# Patient Record
Sex: Female | Born: 1940 | Race: White | Hispanic: No | Smoking: Never smoker
Health system: Southern US, Community
[De-identification: ages and names within clinical notes are randomized; demographics above are authoritative.]

## PROBLEM LIST (undated history)

## (undated) DIAGNOSIS — I4891 Unspecified atrial fibrillation: Secondary | ICD-10-CM

## (undated) DIAGNOSIS — G459 Transient cerebral ischemic attack, unspecified: Secondary | ICD-10-CM

## (undated) DIAGNOSIS — N200 Calculus of kidney: Secondary | ICD-10-CM

## (undated) DIAGNOSIS — M1990 Unspecified osteoarthritis, unspecified site: Secondary | ICD-10-CM

## (undated) DIAGNOSIS — I639 Cerebral infarction, unspecified: Secondary | ICD-10-CM

## (undated) HISTORY — PX: OOPHORECTOMY: SHX86

## (undated) HISTORY — PX: APPENDECTOMY: SHX54

## (undated) HISTORY — PX: MASTECTOMY: SHX3

## (undated) HISTORY — PX: CHOLECYSTECTOMY: SHX55

## (undated) HISTORY — PX: TOTAL KNEE ARTHROPLASTY: SHX125

## (undated) HISTORY — PX: HERNIA REPAIR: SHX51

## (undated) HISTORY — DX: Cerebral infarction, unspecified: I63.9

## (undated) HISTORY — PX: ABDOMINAL HYSTERECTOMY: SHX81

---

## 2008-09-15 ENCOUNTER — Emergency Department (HOSPITAL_BASED_OUTPATIENT_CLINIC_OR_DEPARTMENT_OTHER): Admission: EM | Admit: 2008-09-15 | Discharge: 2008-09-16 | Payer: Self-pay | Admitting: Emergency Medicine

## 2008-09-16 ENCOUNTER — Ambulatory Visit: Payer: Self-pay | Admitting: Diagnostic Radiology

## 2009-07-05 ENCOUNTER — Emergency Department (HOSPITAL_BASED_OUTPATIENT_CLINIC_OR_DEPARTMENT_OTHER): Admission: EM | Admit: 2009-07-05 | Discharge: 2009-07-05 | Payer: Self-pay | Admitting: Emergency Medicine

## 2009-07-11 ENCOUNTER — Emergency Department (HOSPITAL_BASED_OUTPATIENT_CLINIC_OR_DEPARTMENT_OTHER): Admission: EM | Admit: 2009-07-11 | Discharge: 2009-07-11 | Payer: Self-pay | Admitting: Emergency Medicine

## 2010-11-18 LAB — CBC
MCHC: 34.2 g/dL (ref 30.0–36.0)
Platelets: 240 10*3/uL (ref 150–400)
RBC: 4.22 MIL/uL (ref 3.87–5.11)
WBC: 9.1 10*3/uL (ref 4.0–10.5)

## 2010-11-18 LAB — DIFFERENTIAL
Eosinophils Absolute: 0.3 10*3/uL (ref 0.0–0.7)
Eosinophils Relative: 4 % (ref 0–5)
Lymphocytes Relative: 28 % (ref 12–46)
Lymphs Abs: 2.6 10*3/uL (ref 0.7–4.0)
Monocytes Absolute: 0.7 10*3/uL (ref 0.1–1.0)
Monocytes Relative: 7 % (ref 3–12)

## 2010-11-18 LAB — COMPREHENSIVE METABOLIC PANEL
ALT: 21 U/L (ref 0–35)
AST: 33 U/L (ref 0–37)
Albumin: 3.9 g/dL (ref 3.5–5.2)
CO2: 30 mEq/L (ref 19–32)
Calcium: 9.1 mg/dL (ref 8.4–10.5)
Chloride: 104 mEq/L (ref 96–112)
GFR calc Af Amer: >60 mL/min (ref >60)
GFR calc non Af Amer: >60 mL/min (ref >60)
Sodium: 141 mEq/L (ref 135–145)

## 2010-11-18 LAB — URINE MICROSCOPIC-ADD ON

## 2010-11-18 LAB — URINALYSIS, ROUTINE W REFLEX MICROSCOPIC
Bilirubin Urine: NEGATIVE
Hgb urine dipstick: NEGATIVE
Specific Gravity, Urine: 1.017 (ref 1.005–1.030)
pH: 7 (ref 5.0–8.0)

## 2010-11-18 LAB — LIPASE, BLOOD: Lipase: 146 U/L (ref 23–300)

## 2012-03-06 ENCOUNTER — Encounter (HOSPITAL_BASED_OUTPATIENT_CLINIC_OR_DEPARTMENT_OTHER): Payer: Self-pay | Admitting: *Deleted

## 2012-03-06 ENCOUNTER — Emergency Department (HOSPITAL_BASED_OUTPATIENT_CLINIC_OR_DEPARTMENT_OTHER)
Admission: EM | Admit: 2012-03-06 | Discharge: 2012-03-06 | Disposition: A | Discharge status: Home / Self Care | Payer: Medicare Other | Attending: Emergency Medicine | Admitting: Emergency Medicine

## 2012-03-06 DIAGNOSIS — T63481A Toxic effect of venom of other arthropod, accidental (unintentional), initial encounter: Secondary | ICD-10-CM | POA: Insufficient documentation

## 2012-03-06 DIAGNOSIS — Z8673 Personal history of transient ischemic attack (TIA), and cerebral infarction without residual deficits: Secondary | ICD-10-CM | POA: Insufficient documentation

## 2012-03-06 DIAGNOSIS — T6391XA Toxic effect of contact with unspecified venomous animal, accidental (unintentional), initial encounter: Secondary | ICD-10-CM | POA: Insufficient documentation

## 2012-03-06 DIAGNOSIS — T7840XA Allergy, unspecified, initial encounter: Secondary | ICD-10-CM

## 2012-03-06 DIAGNOSIS — M129 Arthropathy, unspecified: Secondary | ICD-10-CM | POA: Insufficient documentation

## 2012-03-06 HISTORY — DX: Unspecified osteoarthritis, unspecified site: M19.90

## 2012-03-06 HISTORY — DX: Calculus of kidney: N20.0

## 2012-03-06 HISTORY — DX: Transient cerebral ischemic attack, unspecified: G45.9

## 2012-03-06 MED ORDER — PREDNISONE 10 MG PO TABS: 20.0000 mg | ORAL_TABLET | Freq: Every day | ORAL | Status: DC

## 2012-03-06 MED ORDER — DIPHENHYDRAMINE HCL 25 MG PO TABS: 25.0000 mg | ORAL_TABLET | Freq: Four times a day (QID) | ORAL | Status: DC

## 2012-03-06 NOTE — ED Notes (Signed)
Pt reports she was stung by insect yesterday right forearm- redness and swelling present

## 2012-03-06 NOTE — ED Provider Notes (Signed)
History   This chart was scribed for Megan Quarry, MD by Shari Heritage. The patient was seen in room MH12/MH12. Patient's care was started at 2106.     CSN: [id number]  Arrival date & time 03/06/12  2106   First MD Initiated Contact with Patient 03/06/12 2152      Chief Complaint  Patient presents with  . Insect Bite    (Consider location/radiation/quality/duration/timing/severity/associated sxs/prior treatment) Patient is a 71 y.o. female presenting with allergic reaction. The history is provided by the patient. No language interpreter was used.  Allergic Reaction The primary symptoms are  rash. The current episode started yesterday. The problem has not changed since onset.This is a new problem.  The rash began yesterday. The rash appears on the right arm. The rash is associated with itching. The rash is not associated with blisters.  The onset of the reaction was associated with insect bite/sting ((possibly, but cause may be unknown)). Significant symptoms also include flushing and itching. Significant symptoms that are not present include eye redness or rhinorrhea.    Megan Riley is a 71 y.o. female who presents to the Emergency Department complaining of an insect bite to her right forearm 1 day ago. Patient says that she did not see the insect bite her, but she found a black stinger in her arm after working outside in her garden. Associated symptoms include redness, swelling and itching to the bite area. Patient denies any other symptoms or pain. Patient says that she has applied rubbing alcohol and Neosporin to the area with no relief from symptoms. Patient has a medical history of kidney stones, TIA and arthritis. Her surgical history includes mastectomy, oophorectomy, abdominal hysterectomy, total knee arthroplasty and appendectomy. Patient has never smoked.     Past Medical History  Diagnosis Date  . Kidney stones   . TIA (transient ischemic attack)   . Arthritis     Past  Surgical History  Procedure Date  . Mastectomy   . Oophorectomy   . Abdominal hysterectomy   . Total knee arthroplasty   . Appendectomy     No family history on file.  History  Substance Use Topics  . Smoking status: Never Smoker   . Smokeless tobacco: Never Used  . Alcohol Use: No    OB History    Grav Para Term Preterm Abortions TAB SAB Ect Mult Living                  Review of Systems  HENT: Negative for rhinorrhea.   Eyes: Negative for redness.  Skin: Positive for flushing, itching and rash.  All other systems reviewed and are negative.    Allergies  Reglan  Home Medications  No current outpatient prescriptions on file.  BP 162/78  Pulse 70  Temp 97.9 F (36.6 C) (Oral)  Resp 20  Ht 5' (1.524 m)  Wt 157 lb (71.215 kg)  BMI 30.66 kg/m2  SpO2 100%  Physical Exam  Nursing note and vitals reviewed. Constitutional: She is oriented to person, place, and time. She appears well-developed and well-nourished.  HENT:  Head: Normocephalic and atraumatic.  Eyes: Pupils are equal, round, and reactive to light.  Neck: Normal range of motion. Neck supple.  Cardiovascular: Normal rate and regular rhythm.   Pulmonary/Chest: Effort normal and breath sounds normal.  Abdominal: Soft. Bowel sounds are normal.  Musculoskeletal: Normal range of motion.  Neurological: She is alert and oriented to person, place, and time.  Skin: Skin is warm and  dry.       8 cm long area to right forearm with erythematous macules. Not warm. Not cellulitic.   Psychiatric: She has a normal mood and affect.    ED Course  Procedures (including critical care time) DIAGNOSTIC STUDIES: Oxygen Saturation is 100% on room air, normal by my interpretation.    COORDINATION OF CARE: 9:53pm- Patient informed of current plan for treatment and evaluation and agrees with plan at this time. Will discharge patient with prescriptions for Benadryl and Prednisone.    Labs Reviewed - No data to  display No results found.   No diagnosis found.    MDM  I personally performed the services described in this documentation, which was scribed in my presence. The recorded information has been reviewed and considered.   Megan Quarry, MD 03/06/12 [phone]

## 2013-08-12 ENCOUNTER — Emergency Department (HOSPITAL_BASED_OUTPATIENT_CLINIC_OR_DEPARTMENT_OTHER): Payer: Medicare Other

## 2013-08-12 ENCOUNTER — Emergency Department (HOSPITAL_BASED_OUTPATIENT_CLINIC_OR_DEPARTMENT_OTHER)
Admission: EM | Admit: 2013-08-12 | Discharge: 2013-08-12 | Disposition: A | Discharge status: Home / Self Care | Payer: Medicare Other | Attending: Emergency Medicine | Admitting: Emergency Medicine

## 2013-08-12 ENCOUNTER — Encounter (HOSPITAL_BASED_OUTPATIENT_CLINIC_OR_DEPARTMENT_OTHER): Payer: Self-pay | Admitting: Emergency Medicine

## 2013-08-12 DIAGNOSIS — R350 Frequency of micturition: Secondary | ICD-10-CM | POA: Insufficient documentation

## 2013-08-12 DIAGNOSIS — R1310 Dysphagia, unspecified: Secondary | ICD-10-CM | POA: Insufficient documentation

## 2013-08-12 DIAGNOSIS — R5381 Other malaise: Secondary | ICD-10-CM | POA: Insufficient documentation

## 2013-08-12 DIAGNOSIS — J029 Acute pharyngitis, unspecified: Secondary | ICD-10-CM | POA: Insufficient documentation

## 2013-08-12 DIAGNOSIS — Z79899 Other long term (current) drug therapy: Secondary | ICD-10-CM | POA: Insufficient documentation

## 2013-08-12 DIAGNOSIS — R5383 Other fatigue: Secondary | ICD-10-CM

## 2013-08-12 DIAGNOSIS — Z8739 Personal history of other diseases of the musculoskeletal system and connective tissue: Secondary | ICD-10-CM | POA: Insufficient documentation

## 2013-08-12 DIAGNOSIS — R531 Weakness: Secondary | ICD-10-CM

## 2013-08-12 DIAGNOSIS — R109 Unspecified abdominal pain: Secondary | ICD-10-CM | POA: Insufficient documentation

## 2013-08-12 DIAGNOSIS — R110 Nausea: Secondary | ICD-10-CM | POA: Insufficient documentation

## 2013-08-12 DIAGNOSIS — Z87442 Personal history of urinary calculi: Secondary | ICD-10-CM | POA: Insufficient documentation

## 2013-08-12 DIAGNOSIS — Z8673 Personal history of transient ischemic attack (TIA), and cerebral infarction without residual deficits: Secondary | ICD-10-CM | POA: Insufficient documentation

## 2013-08-12 DIAGNOSIS — J3489 Other specified disorders of nose and nasal sinuses: Secondary | ICD-10-CM | POA: Insufficient documentation

## 2013-08-12 LAB — CBC WITH DIFFERENTIAL/PLATELET
BASOS ABS: 0 10*3/uL (ref 0.0–0.1)
BASOS PCT: 0 % (ref 0–1)
Eosinophils Absolute: 0.3 10*3/uL (ref 0.0–0.7)
Eosinophils Relative: 3 % (ref 0–5)
HCT: 44.9 % (ref 36.0–46.0)
Hemoglobin: 14.7 g/dL (ref 12.0–15.0)
Lymphocytes Relative: 28 % (ref 12–46)
Lymphs Abs: 2.5 10*3/uL (ref 0.7–4.0)
MCH: 28.4 pg (ref 26.0–34.0)
MCHC: 32.7 g/dL (ref 30.0–36.0)
MCV: 86.8 fL (ref 78.0–100.0)
MONO ABS: 0.9 10*3/uL (ref 0.1–1.0)
Monocytes Relative: 10 % (ref 3–12)
NEUTROS ABS: 5.3 10*3/uL (ref 1.7–7.7)
NEUTROS PCT: 59 % (ref 43–77)
Platelets: 266 10*3/uL (ref 150–400)
RBC: 5.17 MIL/uL — ABNORMAL HIGH (ref 3.87–5.11)
RDW: 14.3 % (ref 11.5–15.5)
WBC: 9 10*3/uL (ref 4.0–10.5)

## 2013-08-12 LAB — URINALYSIS, ROUTINE W REFLEX MICROSCOPIC
Bilirubin Urine: NEGATIVE
Glucose, UA: NEGATIVE mg/dL
Hgb urine dipstick: NEGATIVE
KETONES UR: NEGATIVE mg/dL
NITRITE: NEGATIVE
PROTEIN: NEGATIVE mg/dL
Specific Gravity, Urine: 1.016 (ref 1.005–1.030)
UROBILINOGEN UA: 0.2 mg/dL (ref 0.0–1.0)
pH: 6.5 (ref 5.0–8.0)

## 2013-08-12 LAB — TROPONIN I: Troponin I: <0.3 ng/mL (ref <0.30)

## 2013-08-12 LAB — COMPREHENSIVE METABOLIC PANEL
ALBUMIN: 4 g/dL (ref 3.5–5.2)
ALT: 18 U/L (ref 0–35)
AST: 17 U/L (ref 0–37)
Alkaline Phosphatase: 107 U/L (ref 39–117)
BILIRUBIN TOTAL: 0.5 mg/dL (ref 0.3–1.2)
BUN: 9 mg/dL (ref 6–23)
CHLORIDE: 101 meq/L (ref 96–112)
CO2: 29 mEq/L (ref 19–32)
CREATININE: 0.8 mg/dL (ref 0.50–1.10)
Calcium: 9.2 mg/dL (ref 8.4–10.5)
GFR calc Af Amer: 85 mL/min — ABNORMAL LOW (ref >90)
GFR calc non Af Amer: 73 mL/min — ABNORMAL LOW (ref >90)
Glucose, Bld: 99 mg/dL (ref 70–99)
POTASSIUM: 4.1 meq/L (ref 3.7–5.3)
Sodium: 142 mEq/L (ref 137–147)
Total Protein: 7.5 g/dL (ref 6.0–8.3)

## 2013-08-12 LAB — URINE MICROSCOPIC-ADD ON

## 2013-08-12 LAB — RAPID STREP SCREEN (MED CTR MEBANE ONLY): STREPTOCOCCUS, GROUP A SCREEN (DIRECT): NEGATIVE

## 2013-08-12 MED ORDER — SODIUM CHLORIDE 0.9 % IV BOLUS (SEPSIS)
500.0000 mL | Freq: Once | INTRAVENOUS | Status: AC
  Administered 2013-08-12: 500 mL via INTRAVENOUS

## 2013-08-12 MED ORDER — ONDANSETRON 4 MG PO TBDP: ORAL_TABLET | ORAL | Status: DC

## 2013-08-12 MED ORDER — ONDANSETRON HCL 4 MG/2ML IJ SOLN
4.0000 mg | Freq: Once | INTRAMUSCULAR | Status: AC
  Administered 2013-08-12: 4 mg via INTRAVENOUS
  Filled 2013-08-12: qty 2

## 2013-08-12 NOTE — ED Provider Notes (Signed)
CSN: [id number]     Arrival date & time 08/12/13  [id number] History   First MD Initiated Contact with Patient 08/12/13 [phone]     Chief Complaint  Patient presents with  . Fatigue  . Sore Throat  . Nasal Congestion   (Consider location/radiation/quality/duration/timing/severity/associated sxs/prior Treatment) HPI Comments: 73 yo female with tia, on asa hx presents with general weakness and fatigue worsening the past week.  Pt has had multiple other sxs including cough, sore throat, recent nausea the past two days, mild epig cramping.  She has noticed randomly more difficulty swallowing food however usually swallows and keeps down both solids and liquids, no scope hx.  No known CA or recent weight loss.  Pt feels general fatigue, worse in the morning and with doing anything.  No cp or sob.  No cardiac hx. Non smoker, no alcohol.   Patient is a 73 y.o. female presenting with pharyngitis. The history is provided by the patient.  Sore Throat Associated symptoms include abdominal pain. Pertinent negatives include no chest pain, no headaches and no shortness of breath.    Past Medical History  Diagnosis Date  . Kidney stones   . TIA (transient ischemic attack)   . Arthritis    Past Surgical History  Procedure Laterality Date  . Mastectomy    . Oophorectomy    . Abdominal hysterectomy    . Total knee arthroplasty    . Appendectomy     No family history on file. History  Substance Use Topics  . Smoking status: Never Smoker   . Smokeless tobacco: Never Used  . Alcohol Use: No   OB History   Grav Para Term Preterm Abortions TAB SAB Ect Mult Living                 Review of Systems  Constitutional: Positive for fatigue. Negative for fever, chills and unexpected weight change.  HENT: Positive for congestion and sore throat.   Eyes: Negative for visual disturbance.  Respiratory: Negative for shortness of breath.   Cardiovascular: Negative for chest pain.  Gastrointestinal: Positive for  nausea and abdominal pain. Negative for vomiting.  Genitourinary: Positive for frequency. Negative for dysuria and flank pain.  Musculoskeletal: Negative for back pain, neck pain and neck stiffness.  Skin: Negative for rash.  Neurological: Positive for weakness. Negative for speech difficulty, light-headedness, numbness and headaches.    Allergies  Reglan  Home Medications   Current Outpatient Rx  Name  Route  Sig  Dispense  Refill  . aspirin 81 MG tablet   Oral   Take 81 mg by mouth daily.         Marland Kitchen. esomeprazole (NEXIUM) 20 MG capsule   Oral   Take 20 mg by mouth daily at 12 noon.          BP 141/80  Pulse 82  Temp(Src) 98 F (36.7 C) (Oral)  Resp 22  Ht 5' (1.524 m)  Wt 160 lb (72.576 kg)  BMI 31.25 kg/m2  SpO2 98% Physical Exam  Nursing note and vitals reviewed. Constitutional: She is oriented to person, place, and time. She appears well-developed and well-nourished.  HENT:  Head: Normocephalic and atraumatic.  Mild dry mm No trismus, uvular deviation, unilateral posterior pharyngeal edema or submandibular swelling.   Eyes: Conjunctivae are normal. Right eye exhibits no discharge. Left eye exhibits no discharge.  Neck: Normal range of motion. Neck supple. No tracheal deviation present.  Cardiovascular: Normal rate and regular rhythm.   No  murmur heard. Pulmonary/Chest: Effort normal and breath sounds normal.  Abdominal: Soft. She exhibits no distension. There is no tenderness. There is no guarding.  Musculoskeletal: She exhibits no edema and no tenderness.  Neurological: She is alert and oriented to person, place, and time.  5+ strength in UE and LE with f/e at major joints. Sensation to palpation intact in UE and LE. CNs 2-12 grossly intact.  EOMFI.  PERRL.   Finger nose and coordination intact bilateral.      Skin: Skin is warm. No rash noted.  Psychiatric: She has a normal mood and affect.    ED Course  Procedures (including critical care  time) Labs Review Labs Reviewed  URINALYSIS, ROUTINE W REFLEX MICROSCOPIC - Abnormal; Notable for the following:    Leukocytes, UA SMALL (*)    All other components within normal limits  CBC WITH DIFFERENTIAL - Abnormal; Notable for the following:    RBC 5.17 (*)    All other components within normal limits  COMPREHENSIVE METABOLIC PANEL - Abnormal; Notable for the following:    GFR calc non Af Amer 73 (*)    GFR calc Af Amer 85 (*)    All other components within normal limits  URINE MICROSCOPIC-ADD ON - Abnormal; Notable for the following:    Bacteria, UA FEW (*)    All other components within normal limits  RAPID STREP SCREEN  CULTURE, GROUP A STREP  TROPONIN I   Imaging Review Dg Chest 2 View  08/12/2013   CLINICAL DATA:  SORE THROAT, WEAKNESS, NAUSEA  EXAM: CHEST  2 VIEW  COMPARISON:  02/22/2013  FINDINGS: NORMAL HEART SIZE. LUNGS CLEAR. DENSE BREAST SHADOWS COMPATIBLE WITH IMPLANTS. NO FOCAL PNEUMONIA, COLLAPSE OR CONSOLIDATION. NO EDEMA PATTERN, PLEURAL EFFUSION OR PNEUMOTHORAX. TRACHEA MIDLINE. ATHEROSCLEROSIS NOTED OF THE AORTA. DEGENERATIVE CHANGES OF THE SPINE.  IMPRESSION: NO ACUTE CHEST PROCESS.   Electronically Signed   By: Ruel Favors M.D.   On: 08/12/2013 10:01    EKG Interpretation    Date/Time:  Saturday August 12 2013 09:33:25 EST Ventricular Rate:  67 PR Interval:  122 QRS Duration: 82 QT Interval:  424 QTC Calculation: 448 R Axis:   -2 Text Interpretation:  Normal sinus rhythm Normal ECG Confirmed by Kiyanna Biegler  MD, Klynn Linnemann ([id number]) on 08/12/2013 9:39:47 AM            MDM   1. General weakness   2. Sore throat   3. Difficulty swallowing   4. Nausea    Multiple vague sxs.  With age and general weakness plan on screening for metabolic, infection and cardiac. Well appearing in ED. EKG no acute findings.   Pt has close fup outpt. Normal exam in ED.  Fluids and nausea meds in ED>  Pt improved in ED, no abnormality on work up, pt will follow closely  with pcp.   Results and differential diagnosis were discussed with the patient. Close follow up outpatient was discussed, patient comfortable with the plan.         Enid Skeens, MD 08/12/13 1056

## 2013-08-12 NOTE — Discharge Instructions (Signed)
Follow closely with your physician, discuss checking your thyroid. If swallowing difficulty continues see GI doctor. If you have stroke symptoms (weakness on one side, vision changes, speech changes, other) return to ER immediately.  If you were given medicines take as directed.  If you are on coumadin or contraceptives realize their levels and effectiveness is altered by many different medicines.  If you have any reaction (rash, tongues swelling, other) to the medicines stop taking and see a physician.   Please follow up as directed and return to the ER or see a physician for new or worsening symptoms.  Thank you.

## 2013-08-12 NOTE — ED Notes (Addendum)
Sinus drainage, sore throat, generalized weakness, some nausea, some upper abdominal pain.  No known fever.  Increased urination.  Occasional difficulty swallowing food.

## 2013-08-12 NOTE — ED Notes (Signed)
Patient transported to X-ray 

## 2013-08-14 LAB — CULTURE, GROUP A STREP

## 2013-10-28 ENCOUNTER — Emergency Department (HOSPITAL_BASED_OUTPATIENT_CLINIC_OR_DEPARTMENT_OTHER)
Admission: EM | Admit: 2013-10-28 | Discharge: 2013-10-29 | Disposition: A | Discharge status: Home / Self Care | Payer: Medicare Other | Attending: Emergency Medicine | Admitting: Emergency Medicine

## 2013-10-28 ENCOUNTER — Encounter (HOSPITAL_BASED_OUTPATIENT_CLINIC_OR_DEPARTMENT_OTHER): Payer: Self-pay | Admitting: Emergency Medicine

## 2013-10-28 DIAGNOSIS — Z79899 Other long term (current) drug therapy: Secondary | ICD-10-CM | POA: Insufficient documentation

## 2013-10-28 DIAGNOSIS — R51 Headache: Secondary | ICD-10-CM | POA: Insufficient documentation

## 2013-10-28 DIAGNOSIS — I10 Essential (primary) hypertension: Secondary | ICD-10-CM

## 2013-10-28 DIAGNOSIS — M129 Arthropathy, unspecified: Secondary | ICD-10-CM | POA: Insufficient documentation

## 2013-10-28 DIAGNOSIS — Z8673 Personal history of transient ischemic attack (TIA), and cerebral infarction without residual deficits: Secondary | ICD-10-CM | POA: Insufficient documentation

## 2013-10-28 DIAGNOSIS — Z7982 Long term (current) use of aspirin: Secondary | ICD-10-CM | POA: Insufficient documentation

## 2013-10-28 DIAGNOSIS — Z87442 Personal history of urinary calculi: Secondary | ICD-10-CM | POA: Insufficient documentation

## 2013-10-28 NOTE — ED Notes (Signed)
Pt reports b/p elevated and has headache

## 2013-10-29 NOTE — ED Provider Notes (Signed)
CSN: [id number]     Arrival date & time 10/28/13  2254 History  This chart was scribed for No att. providers found by Marica Otter, ED Scribe. This patient was seen in room MHOTF/OTF and the patient's care was started at 6:01 AM.  PCP: Toniann Fail, MD    Chief Complaint  Patient presents with  . Hypertension   HPI HPI Comments: Shamera Yarberry is a 73 y.o. female who presents to the Emergency Department complaining of HTN onset two days ago. Pt reports her daughter took her blood pressure two nights ago on a whim and her BP was elevated. Pt reports she checked her BP a few more times and each reading was elevated as high as 199/91. Pt also complains of associated headache, onset yesterday, which is mild.. Pt denies having any history of HTN, SOB, changes in vision, chest pain and changes in urination patterns. Her blood pressure on arrival here was 167/66.   Past Medical History  Diagnosis Date  . Kidney stones   . TIA (transient ischemic attack)   . Arthritis    Past Surgical History  Procedure Laterality Date  . Mastectomy    . Oophorectomy    . Abdominal hysterectomy    . Total knee arthroplasty    . Appendectomy     No family history on file. History  Substance Use Topics  . Smoking status: Never Smoker   . Smokeless tobacco: Never Used  . Alcohol Use: No   OB History   Grav Para Term Preterm Abortions TAB SAB Ect Mult Living                 Review of Systems  Eyes: Negative for visual disturbance.  Respiratory: Negative for shortness of breath.   Cardiovascular: Negative for chest pain.  Neurological: Positive for headaches.  All other systems reviewed and are negative.   A complete 10 system review of systems was obtained and all systems are negative except as noted in the HPI and PMH.     Allergies  Reglan  Home Medications   Current Outpatient Rx  Name  Route  Sig  Dispense  Refill  . aspirin 81 MG tablet   Oral   Take 81 mg by mouth daily.          Marland Kitchen esomeprazole (NEXIUM) 20 MG capsule   Oral   Take 20 mg by mouth daily at 12 noon.         . ondansetron (ZOFRAN ODT) 4 MG disintegrating tablet      4mg  ODT q4 hours prn nausea/vomit   4 tablet   0    BP 167/66  Pulse 77  Temp(Src) 98.3 F (36.8 C) (Oral)  Resp 20  Ht 5' (1.524 m)  Wt 160 lb (72.576 kg)  BMI 31.25 kg/m2  SpO2 99% Physical Exam  Nursing note and vitals reviewed.  General: Well-developed, well-nourished female in no acute distress; appearance consistent with age of record HENT: normocephalic; atraumatic Eyes: pupils equal, round and reactive to light; extraocular muscles intact. Arcus senilis bilaterally.  Neck: supple Heart: regular rate and rhythm; no murmurs, rubs or gallops Lungs: clear to auscultation bilaterally Abdomen: soft; nondistended; nontender; no masses or hepatosplenomegaly; bowel sounds present Extremities: Mild arthritic changes; full range of motion; pulses normal, no edema.  Neurologic: Awake, alert and oriented; motor function intact in all extremities and symmetric; no facial droop Skin: Warm and dry Psychiatric: Normal mood and affect  ED Course  Procedures (including critical  care time) DIAGNOSTIC STUDIES: Oxygen Saturation is 99% on RA, normal by my interpretation.    COORDINATION OF CARE:  12:37 AM-Discussed treatment plan which includes: (1) labs, (2) not starting any hypertensive at this time because no evidence of end-organ damage, and (3) pt is advised to follow up with PCP. Pt understands and agrees to the plan, except pt chooses not to do labs at this time due to normal lab work two months ago.  Patient's blood pressure noted to be even lower at time of discharge than at time of arrival.  MDM   Final diagnoses:  Hypertension    I personally performed the services described in this documentation, which was scribed in my presence. The recorded information has been reviewed and is accurate.   Hanley SeamenJohn L Johnathen Testa,  MD 10/29/13 [phone]

## 2014-12-11 ENCOUNTER — Emergency Department (HOSPITAL_BASED_OUTPATIENT_CLINIC_OR_DEPARTMENT_OTHER)
Admission: EM | Admit: 2014-12-11 | Discharge: 2014-12-11 | Disposition: A | Discharge status: Home / Self Care | Payer: Medicare Other | Attending: Emergency Medicine | Admitting: Emergency Medicine

## 2014-12-11 ENCOUNTER — Emergency Department (HOSPITAL_BASED_OUTPATIENT_CLINIC_OR_DEPARTMENT_OTHER): Payer: Medicare Other

## 2014-12-11 ENCOUNTER — Encounter (HOSPITAL_BASED_OUTPATIENT_CLINIC_OR_DEPARTMENT_OTHER): Payer: Self-pay | Admitting: *Deleted

## 2014-12-11 DIAGNOSIS — Z8673 Personal history of transient ischemic attack (TIA), and cerebral infarction without residual deficits: Secondary | ICD-10-CM | POA: Diagnosis not present

## 2014-12-11 DIAGNOSIS — M1990 Unspecified osteoarthritis, unspecified site: Secondary | ICD-10-CM | POA: Diagnosis not present

## 2014-12-11 DIAGNOSIS — Z87442 Personal history of urinary calculi: Secondary | ICD-10-CM | POA: Diagnosis not present

## 2014-12-11 DIAGNOSIS — Y9339 Activity, other involving climbing, rappelling and jumping off: Secondary | ICD-10-CM | POA: Diagnosis not present

## 2014-12-11 DIAGNOSIS — W108XXA Fall (on) (from) other stairs and steps, initial encounter: Secondary | ICD-10-CM | POA: Insufficient documentation

## 2014-12-11 DIAGNOSIS — Y998 Other external cause status: Secondary | ICD-10-CM | POA: Insufficient documentation

## 2014-12-11 DIAGNOSIS — Z7982 Long term (current) use of aspirin: Secondary | ICD-10-CM | POA: Diagnosis not present

## 2014-12-11 DIAGNOSIS — Z79899 Other long term (current) drug therapy: Secondary | ICD-10-CM | POA: Diagnosis not present

## 2014-12-11 DIAGNOSIS — S8991XA Unspecified injury of right lower leg, initial encounter: Secondary | ICD-10-CM

## 2014-12-11 DIAGNOSIS — Y9289 Other specified places as the place of occurrence of the external cause: Secondary | ICD-10-CM | POA: Insufficient documentation

## 2014-12-11 NOTE — ED Notes (Signed)
Right knee injury. She jumped off a 3' deck.

## 2014-12-11 NOTE — ED Provider Notes (Signed)
CSN: [id number]     Arrival date & time 12/11/14  [id number] History  This chart was scribed for Elwin MochaBlair Kateryn Marasigan, MD by Abel PrestoKara Demonbreun, ED Scribe. This patient was seen in room MHFT1/MHFT1 and the patient's care was started at 7:42 PM.       Chief Complaint  Patient presents with  . Knee Injury     Patient is a 74 y.o. female presenting with knee pain. The history is provided by the patient. No language interpreter was used.  Knee Pain Location:  Knee Injury: yes   Mechanism of injury: fall   Fall:    Fall occurred: from a porch.   Height of fall:  3 feet   Impact surface:  Grass   Point of impact:  Feet   Entrapped after fall: no   Knee location:  R knee Pain details:    Quality:  Aching   Severity:  Mild   Onset quality:  Gradual (began a few hours after the fall)   Progression:  Unchanged Chronicity:  New Relieved by:  Rest Worsened by:  Bearing weight Associated symptoms: no fever    HPI Comments: Elder NegusLinda Langford is a 74 y.o. female with PMHx of TIA, arthritis, and renal calculi who presents to the Emergency Department complaining of right knee pain with onset just PTA.  Pt states she was cleaning deck with son and did not see stairs so she jumped down. She was wearing flip flops at the time of incident. Deck is approximately 3' high. Pt states right knee gave way and caused her to fall. She denies hearing a popping sensation or feeling of dislocation. She was able to ambulate afterwards. She notes mild swelling. Pt tried icing for relief. Pt denies hip pain, ankle pain, numbness, and tingling.   Past Medical History  Diagnosis Date  . Kidney stones   . TIA (transient ischemic attack)   . Arthritis    Past Surgical History  Procedure Laterality Date  . Mastectomy    . Oophorectomy    . Abdominal hysterectomy    . Total knee arthroplasty    . Appendectomy     No family history on file. History  Substance Use Topics  . Smoking status: Never Smoker   . Smokeless tobacco:  Never Used  . Alcohol Use: No   OB History    No data available     Review of Systems  Constitutional: Negative for fever and chills.  Respiratory: Negative for cough and shortness of breath.   Gastrointestinal: Negative for vomiting and abdominal pain.  Musculoskeletal: Positive for joint swelling and arthralgias.  Skin: Negative for wound.  Neurological: Positive for weakness. Negative for numbness.  All other systems reviewed and are negative.     Allergies  Reglan  Home Medications   Prior to Admission medications   Medication Sig Start Date End Date Taking? Authorizing Provider  Ranitidine HCl (ZANTAC PO) Take by mouth.   Yes Historical Provider, MD  aspirin 81 MG tablet Take 81 mg by mouth daily.    Historical Provider, MD  esomeprazole (NEXIUM) 20 MG capsule Take 20 mg by mouth daily at 12 noon.    Historical Provider, MD  ondansetron (ZOFRAN ODT) 4 MG disintegrating tablet 4mg  ODT q4 hours prn nausea/vomit 08/12/13   Blane OharaJoshua Zavitz, MD   Pulse 73  Temp(Src) 98.3 F (36.8 C) (Oral)  Resp 20  Ht 4' 11.5" (1.511 m)  Wt 157 lb (71.215 kg)  BMI 31.19 kg/m2  SpO2 100%  Physical Exam  Constitutional: She is oriented to person, place, and time. She appears well-developed and well-nourished. No distress.  HENT:  Head: Normocephalic and atraumatic.  Mouth/Throat: Oropharynx is clear and moist.  Eyes: EOM are normal. Pupils are equal, round, and reactive to light.  Neck: Normal range of motion. Neck supple.  Cardiovascular: Normal rate and regular rhythm.  Exam reveals no friction rub.   No murmur heard. Pulmonary/Chest: Effort normal and breath sounds normal. No respiratory distress. She has no wheezes. She has no rales.  Abdominal: Soft. She exhibits no distension. There is no tenderness. There is no rebound.  Musculoskeletal: Normal range of motion. She exhibits no edema.       Right knee: She exhibits normal range of motion, no swelling, no effusion, no laceration, no  erythema, normal alignment, no LCL laxity and no MCL laxity. Tenderness (upper medial tibia) found. Medial joint line tenderness noted. No MCL and no LCL tenderness noted.  Neurological: She is alert and oriented to person, place, and time.  Skin: She is not diaphoretic.  Nursing note and vitals reviewed.   ED Course  Procedures (including critical care time) DIAGNOSTIC STUDIES: Oxygen Saturation is 100% on room air, normal by my interpretation.    COORDINATION OF CARE: 7:46 PM Discussed treatment plan with patient at beside, the patient agrees with the plan and has no further questions at this time.   Labs Review Labs Reviewed - No data to display  Imaging Review Dg Knee Complete 4 Views Right  12/11/2014   CLINICAL DATA:  Patient jumped 3 feet off of deck, injuring right knee. Pain medially  EXAM: RIGHT KNEE - COMPLETE 4+ VIEW  COMPARISON:  None.  FINDINGS: Frontal, lateral, and bilateral oblique views were obtained. There is no fracture or dislocation. No joint effusion. There is moderate narrowing medially. There is moderate narrowing of the patellofemoral joint. There is spurring in all compartments, most severely along the patellofemoral joint and medially. No erosive change.  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 No fracture or joint effusion.   Electronically Signed   By: Bretta BangWilliam  Woodruff III M.D.   On: 12/11/2014 19:39     EKG Interpretation None      MDM   Final diagnoses:  Knee injury, right, initial encounter   74 year old female here with knee injury. She jumped off a deck and felt pain in her right knee a few hours after jumping. Should not fill immediate pop. Her need to give way after the fall but she was ambulatory afterwards. Here for range of motion of the knee. She has mild upper medial tibial tenderness. X-ray is normal. She's ambulatory here. Instructed to use cane as tolerated. Stable for discharge.  I  personally performed the services described in this documentation, which was scribed in my presence. The recorded information has been reviewed and is accurate.      Elwin MochaBlair Emori Kamau, MD 12/11/14 [phone]

## 2015-06-25 ENCOUNTER — Encounter (HOSPITAL_BASED_OUTPATIENT_CLINIC_OR_DEPARTMENT_OTHER): Payer: Self-pay

## 2015-06-25 ENCOUNTER — Emergency Department (HOSPITAL_BASED_OUTPATIENT_CLINIC_OR_DEPARTMENT_OTHER): Payer: Medicare Other

## 2015-06-25 ENCOUNTER — Emergency Department (HOSPITAL_BASED_OUTPATIENT_CLINIC_OR_DEPARTMENT_OTHER)
Admission: EM | Admit: 2015-06-25 | Discharge: 2015-06-25 | Disposition: A | Discharge status: Home / Self Care | Payer: Medicare Other | Attending: Emergency Medicine | Admitting: Emergency Medicine

## 2015-06-25 DIAGNOSIS — IMO0001 Reserved for inherently not codable concepts without codable children: Secondary | ICD-10-CM

## 2015-06-25 DIAGNOSIS — Z79899 Other long term (current) drug therapy: Secondary | ICD-10-CM | POA: Diagnosis not present

## 2015-06-25 DIAGNOSIS — R51 Headache: Secondary | ICD-10-CM | POA: Diagnosis not present

## 2015-06-25 DIAGNOSIS — R030 Elevated blood-pressure reading, without diagnosis of hypertension: Secondary | ICD-10-CM | POA: Diagnosis not present

## 2015-06-25 DIAGNOSIS — M1990 Unspecified osteoarthritis, unspecified site: Secondary | ICD-10-CM | POA: Insufficient documentation

## 2015-06-25 DIAGNOSIS — Z8673 Personal history of transient ischemic attack (TIA), and cerebral infarction without residual deficits: Secondary | ICD-10-CM | POA: Insufficient documentation

## 2015-06-25 DIAGNOSIS — Z7982 Long term (current) use of aspirin: Secondary | ICD-10-CM | POA: Diagnosis not present

## 2015-06-25 DIAGNOSIS — Z87442 Personal history of urinary calculi: Secondary | ICD-10-CM | POA: Insufficient documentation

## 2015-06-25 DIAGNOSIS — R519 Headache, unspecified: Secondary | ICD-10-CM

## 2015-06-25 MED ORDER — TRAMADOL HCL 50 MG PO TABS
50.0000 mg | ORAL_TABLET | Freq: Once | ORAL | Status: AC
  Administered 2015-06-25: 50 mg via ORAL
  Filled 2015-06-25: qty 1

## 2015-06-25 NOTE — ED Provider Notes (Signed)
CSN: [id number]     Arrival date & time 06/25/15  1259 History   First MD Initiated Contact with Patient 06/25/15 1308     Chief Complaint  Patient presents with  . Headache     (Consider location/radiation/quality/duration/timing/severity/associated sxs/prior Treatment) Patient is a 74 y.o. female presenting with headaches. The history is provided by the patient.  Headache Associated symptoms: no abdominal pain, no back pain, no congestion, no cough, no eye pain, no fever, no nausea, no neck pain, no numbness, no sinus pressure, no sore throat, no vomiting and no weakness   Patient c/o onset dull frontal headache at/around 10 AM, at rest. Was milder, more gradual in onset. Indicates slowly became worse. Headache right frontal and behind eye.  No eye pain or change in vision.  No change in speech. No numbness/weakness.   Pt indicates normally does not get headaches, indicates remote hx migraine. No sinus drainage or congestion. No neck pain or stiffness. No fevers at home. No recent head injury, trauma, fall. No syncope.   Pt states then took bp at home and was concerned as was high, 180's/90's.      Past Medical History  Diagnosis Date  . Kidney stones   . TIA (transient ischemic attack)   . Arthritis    Past Surgical History  Procedure Laterality Date  . Mastectomy    . Oophorectomy    . Abdominal hysterectomy    . Total knee arthroplasty    . Appendectomy    . Cholecystectomy     No family history on file. Social History  Substance Use Topics  . Smoking status: Never Smoker   . Smokeless tobacco: Never Used  . Alcohol Use: No   OB History    No data available     Review of Systems  Constitutional: Negative for fever and chills.  HENT: Negative for congestion, sinus pressure and sore throat.   Eyes: Negative for pain and visual disturbance.  Respiratory: Negative for cough and shortness of breath.   Cardiovascular: Negative for chest pain.  Gastrointestinal:  Negative for nausea, vomiting and abdominal pain.  Genitourinary: Negative for dysuria and flank pain.  Musculoskeletal: Negative for back pain and neck pain.  Skin: Negative for rash.  Neurological: Positive for headaches. Negative for weakness and numbness.  Hematological: Does not bruise/bleed easily.  Psychiatric/Behavioral: Negative for confusion.      Allergies  Reglan  Home Medications   Prior to Admission medications   Medication Sig Start Date End Date Taking? Authorizing Provider  aspirin 81 MG tablet Take 81 mg by mouth daily.    Historical Provider, MD  esomeprazole (NEXIUM) 20 MG capsule Take 20 mg by mouth daily at 12 noon.    Historical Provider, MD  Ranitidine HCl (ZANTAC PO) Take by mouth.    Historical Provider, MD   BP 165/63 mmHg  Pulse 72  Temp(Src) 97.9 F (36.6 C) (Oral)  Resp 16  Ht  (1.499 m)  Wt 68.04 kg  BMI 30.28 kg/m2  SpO2 99% Physical Exam  Constitutional: She is oriented to person, place, and time. She appears well-developed and well-nourished. No distress.  HENT:  Head: Atraumatic.  Nose: Nose normal.  Mouth/Throat: Oropharynx is clear and moist.  No sinus or temporal tenderness.  Eyes: Conjunctivae and EOM are normal. Pupils are equal, round, and reactive to light. No scleral icterus.  Pupils equal, briskly reactive bil, no corneal clouding. No eye pain or pain w eom.   Neck: Neck supple.  No tracheal deviation present. No thyromegaly present.  No stiffness or rigidity.   Cardiovascular: Normal rate, regular rhythm, normal heart sounds and intact distal pulses.  Exam reveals no gallop and no friction rub.   No murmur heard. Pulmonary/Chest: Effort normal and breath sounds normal. No respiratory distress.  Abdominal: Soft. Normal appearance and bowel sounds are normal. She exhibits no distension. There is no tenderness.  Genitourinary:  No cva tenderness.  Musculoskeletal: Normal range of motion. She exhibits no edema or  tenderness.  Neurological: She is alert and oriented to person, place, and time. No cranial nerve deficit.  Motor intact bilaterally. stre 5/5. sens grossly intact. Steady gait.   Skin: Skin is warm and dry. No rash noted. She is not diaphoretic.  Psychiatric: She has a normal mood and affect.  Nursing note and vitals reviewed.   ED Course  Procedures (including critical care time) Labs Review  Results for orders placed or performed during the hospital encounter of 08/12/13  Rapid strep screen  Result Value Ref Range   Streptococcus, Group A Screen (Direct) NEGATIVE NEGATIVE  Culture, Group A Strep  Result Value Ref Range   Specimen Description THROAT    Special Requests NONE    Culture      No Beta Hemolytic Streptococci Isolated Performed at [facility]   Report Status 08/14/2013 FINAL   Urinalysis, Routine w reflex microscopic  Result Value Ref Range   Color, Urine YELLOW YELLOW   APPearance CLEAR CLEAR   Specific Gravity, Urine 1.016 1.005 - 1.030   pH 6.5 5.0 - 8.0   Glucose, UA NEGATIVE NEGATIVE mg/dL   Hgb urine dipstick NEGATIVE NEGATIVE   Bilirubin Urine NEGATIVE NEGATIVE   Ketones, ur NEGATIVE NEGATIVE mg/dL   Protein, ur NEGATIVE NEGATIVE mg/dL   Urobilinogen, UA 0.2 0.0 - 1.0 mg/dL   Nitrite NEGATIVE NEGATIVE   Leukocytes, UA SMALL (A) NEGATIVE  CBC with Differential  Result Value Ref Range   WBC 9.0 4.0 - 10.5 K/uL   RBC 5.17 (H) 3.87 - 5.11 MIL/uL   Hemoglobin 14.7 12.0 - 15.0 g/dL   HCT 40.944.9 81.136.0 - 91.446.0 %   MCV 86.8 78.0 - 100.0 fL   MCH 28.4 26.0 - 34.0 pg   MCHC 32.7 30.0 - 36.0 g/dL   RDW 78.214.3 95.611.5 - 21.315.5 %   Platelets 266 150 - 400 K/uL   Neutrophils Relative % 59 43 - 77 %   Neutro Abs 5.3 1.7 - 7.7 K/uL   Lymphocytes Relative 28 12 - 46 %   Lymphs Abs 2.5 0.7 - 4.0 K/uL   Monocytes Relative 10 3 - 12 %   Monocytes Absolute 0.9 0.1 - 1.0 K/uL   Eosinophils Relative 3 0 - 5 %   Eosinophils Absolute 0.3 0.0 - 0.7 K/uL   Basophils  Relative 0 0 - 1 %   Basophils Absolute 0.0 0.0 - 0.1 K/uL  Comprehensive metabolic panel  Result Value Ref Range   Sodium 142 137 - 147 mEq/L   Potassium 4.1 3.7 - 5.3 mEq/L   Chloride 101 96 - 112 mEq/L   CO2 29 19 - 32 mEq/L   Glucose, Bld 99 70 - 99 mg/dL   BUN 9 6 - 23 mg/dL   Creatinine, Ser 0.860.80 0.50 - 1.10 mg/dL   Calcium 9.2 8.4 - 57.810.5 mg/dL   Total Protein 7.5 6.0 - 8.3 g/dL   Albumin 4.0 3.5 - 5.2 g/dL   AST 17 0 - 37  U/L   ALT 18 0 - 35 U/L   Alkaline Phosphatase 107 39 - 117 U/L   Total Bilirubin 0.5 0.3 - 1.2 mg/dL   GFR calc non Af Amer 73 (L) >90 mL/min   GFR calc Af Amer 85 (L) >90 mL/min  Troponin I  Result Value Ref Range   Troponin I <0.30 <0.30 ng/mL  Urine microscopic-add on  Result Value Ref Range   Squamous Epithelial / LPF RARE RARE   WBC, UA 0-2 <3 WBC/hpf   Bacteria, UA FEW (A) RARE   Ct Head Wo Contrast  06/25/2015  CLINICAL DATA:  Rt sided ha since waking this am. Elevated bp. EXAM: CT HEAD WITHOUT CONTRAST TECHNIQUE: Contiguous axial images were obtained from the base of the skull through the vertex without intravenous contrast. COMPARISON:  08/11/2012 by report only FINDINGS: Diffuse parenchymal atrophy. Patchy areas of hypoattenuation in deep and periventricular white matter bilaterally. Negative for acute intracranial hemorrhage, mass lesion, acute infarction, midline shift, or mass-effect. Acute infarct may be inapparent on noncontrast CT. Ventricles and sulci symmetric. Bone windows demonstrate no focal lesion. IMPRESSION: 1. Negative for bleed or other acute intracranial process. 2. Atrophy and nonspecific white matter changes. Electronically Signed   By: Corlis Leak M.D.   On: 06/25/2015 14:02      I have personally reviewed and evaluated these images and lab results as part of my medical decision-making.    MDM   Reviewed nursing notes and prior charts for additional history.   Pt has ride. Ultram po.  Ct.  Repeat bp.  bp  elevated, but not critically so, or requiring emergent tx.  Recheck pt comfortable. Nad.   Ct neg acute.  Pt currently appears stable for d/c.       Cathren Laine, MD 06/25/15 1422

## 2015-06-25 NOTE — ED Notes (Signed)
Family at bedside. 

## 2015-06-25 NOTE — ED Notes (Signed)
Pt. Has noted clear speech and memory.  Pt. Reports eating breakfast and some nausea with the nausea getting better.  No vomiting.  Pt. Has no neuro deficits.

## 2015-06-25 NOTE — ED Notes (Addendum)
C/o HA and nausea this am-BP at home 184/93-pt NAD-presents to triage in w/c-states she ambulates w/o assist

## 2015-06-25 NOTE — Discharge Instructions (Signed)
It was our pleasure to provide your ER care today - we hope that you feel better.  Take motrin or aleve as need for pain.   Follow up with primary care doctor in 1 week for recheck, recheck of blood pressure.  Return to ER if worse, new symptoms, fevers, severe headache, severe eye pain or change in vision, other concern.  You were given pain medication in the ER - no driving for the next 4 hours.      General Headache Without Cause A headache is pain or discomfort felt around the head or neck area. The specific cause of a headache may not be found. There are many causes and types of headaches. A few common ones are:  Tension headaches.  Migraine headaches.  Cluster headaches.  Chronic daily headaches. HOME CARE INSTRUCTIONS  Watch your condition for any changes. Take these steps to help with your condition: Managing Pain  Take over-the-counter and prescription medicines only as told by your health care provider.  Lie down in a dark, quiet room when you have a headache.  If directed, apply ice to the head and neck area:  Put ice in a plastic bag.  Place a towel between your skin and the bag.  Leave the ice on for 20 minutes, 2-3 times per day.  Use a heating pad or hot shower to apply heat to the head and neck area as told by your health care provider.  Keep lights dim if bright lights bother you or make your headaches worse. Eating and Drinking  Eat meals on a regular schedule.  Limit alcohol use.  Decrease the amount of caffeine you drink, or stop drinking caffeine. General Instructions  Keep all follow-up visits as told by your health care provider. This is important.  Keep a headache journal to help find out what may trigger your headaches. For example, write down:  What you eat and drink.  How much sleep you get.  Any change to your diet or medicines.  Try massage or other relaxation techniques.  Limit stress.  Sit up straight, and do not tense  your muscles.  Do not use tobacco products, including cigarettes, chewing tobacco, or e-cigarettes. If you need help quitting, ask your health care provider.  Exercise regularly as told by your health care provider.  Sleep on a regular schedule. Get 7-9 hours of sleep, or the amount recommended by your health care provider. SEEK MEDICAL CARE IF:   Your symptoms are not helped by medicine.  You have a headache that is different from the usual headache.  You have nausea or you vomit.  You have a fever. SEEK IMMEDIATE MEDICAL CARE IF:   Your headache becomes severe.  You have repeated vomiting.  You have a stiff neck.  You have a loss of vision.  You have problems with speech.  You have pain in the eye or ear.  You have muscular weakness or loss of muscle control.  You lose your balance or have trouble walking.  You feel faint or pass out.  You have confusion.   This information is not intended to replace advice given to you by your health care provider. Make sure you discuss any questions you have with your health care provider.   Document Released: 07/20/2005 Document Revised: 04/10/2015 Document Reviewed: 11/12/2014 Elsevier Interactive Patient Education 2016 [employer].    Hypertension Hypertension, commonly called high blood pressure, is when the force of blood pumping through your arteries is too strong.  Your arteries are the blood vessels that carry blood from your heart throughout your body. A blood pressure reading consists of a higher number over a lower number, such as 110/72. The higher number (systolic) is the pressure inside your arteries when your heart pumps. The lower number (diastolic) is the pressure inside your arteries when your heart relaxes. Ideally you want your blood pressure below 120/80. Hypertension forces your heart to work harder to pump blood. Your arteries may become narrow or stiff. Having untreated or uncontrolled hypertension can cause  heart attack, stroke, kidney disease, and other problems. RISK FACTORS Some risk factors for high blood pressure are controllable. Others are not.  Risk factors you cannot control include:   Race. You may be at higher risk if you are African American.  Age. Risk increases with age.  Gender. Men are at higher risk than women before age 72 years. After age 72, women are at higher risk than men. Risk factors you can control include:  Not getting enough exercise or physical activity.  Being overweight.  Getting too much fat, sugar, calories, or salt in your diet.  Drinking too much alcohol. SIGNS AND SYMPTOMS Hypertension does not usually cause signs or symptoms. Extremely high blood pressure (hypertensive crisis) may cause headache, anxiety, shortness of breath, and nosebleed. DIAGNOSIS To check if you have hypertension, your health care provider will measure your blood pressure while you are seated, with your arm held at the level of your heart. It should be measured at least twice using the same arm. Certain conditions can cause a difference in blood pressure between your right and left arms. A blood pressure reading that is higher than normal on one occasion does not mean that you need treatment. If it is not clear whether you have high blood pressure, you may be asked to return on a different day to have your blood pressure checked again. Or, you may be asked to monitor your blood pressure at home for 1 or more weeks. TREATMENT Treating high blood pressure includes making lifestyle changes and possibly taking medicine. Living a healthy lifestyle can help lower high blood pressure. You may need to change some of your habits. Lifestyle changes may include:  Following the DASH diet. This diet is high in fruits, vegetables, and whole grains. It is low in salt, red meat, and added sugars.  Keep your sodium intake below 2,300 mg per day.  Getting at least 30-45 minutes of aerobic exercise  at least 4 times per week.  Losing weight if necessary.  Not smoking.  Limiting alcoholic beverages.  Learning ways to reduce stress. Your health care provider may prescribe medicine if lifestyle changes are not enough to get your blood pressure under control, and if one of the following is true:  You are 6618-72 years of age and your systolic blood pressure is above 140.  You are 560 years of age or older, and your systolic blood pressure is above 150.  Your diastolic blood pressure is above 90.  You have diabetes, and your systolic blood pressure is over 140 or your diastolic blood pressure is over 90.  You have kidney disease and your blood pressure is above 140/90.  You have heart disease and your blood pressure is above 140/90. Your personal target blood pressure may vary depending on your medical conditions, your age, and other factors. HOME CARE INSTRUCTIONS  Have your blood pressure rechecked as directed by your health care provider.   Take medicines only as  directed by your health care provider. Follow the directions carefully. Blood pressure medicines must be taken as prescribed. The medicine does not work as well when you skip doses. Skipping doses also puts you at risk for problems.  Do not smoke.   Monitor your blood pressure at home as directed by your health care provider. SEEK MEDICAL CARE IF:   You think you are having a reaction to medicines taken.  You have recurrent headaches or feel dizzy.  You have swelling in your ankles.  You have trouble with your vision. SEEK IMMEDIATE MEDICAL CARE IF:  You develop a severe headache or confusion.  You have unusual weakness, numbness, or feel faint.  You have severe chest or abdominal pain.  You vomit repeatedly.  You have trouble breathing. MAKE SURE YOU:   Understand these instructions.  Will watch your condition.  Will get help right away if you are not doing well or get worse.   This information  is not intended to replace advice given to you by your health care provider. Make sure you discuss any questions you have with your health care provider.   Document Released: 07/20/2005 Document Revised: 12/04/2014 Document Reviewed: 05/12/2013 Elsevier Interactive Patient Education [employer]

## 2017-01-07 IMAGING — CT CT HEAD W/O CM
1 series · 16 of 30 positions shown, 20 images · non-contrast
Comparison: 08/11/2012 by report only

CLINICAL DATA: Rt sided ha since waking this am. Elevated bp.

EXAM:
CT HEAD WITHOUT CONTRAST
TECHNIQUE: Contiguous axial images were obtained from the base of the skull
through the vertex without intravenous contrast.

[Series 2: head 4.8 h37s · axial · 0.46mm/px · z∈[+735,+871]mm · 16 of 32 slices shown, 20 images]
[im 2/32  brain]
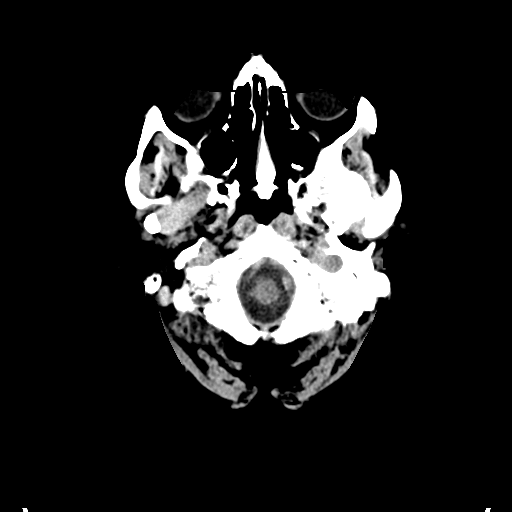
[im 2/32  bone]
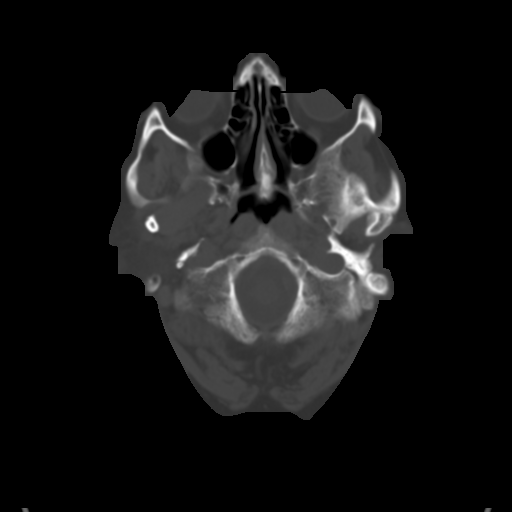
[im 4/32  brain]
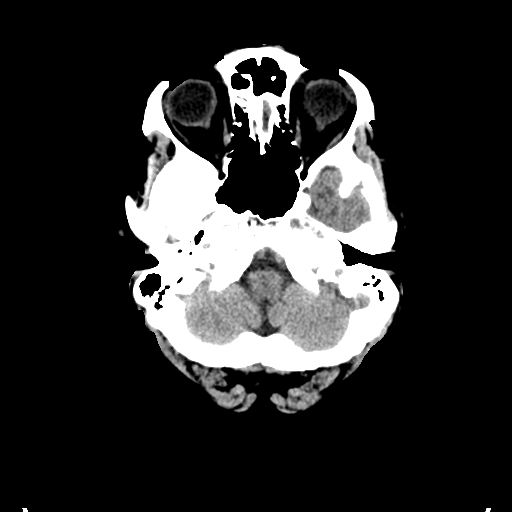
[im 6/32  brain]
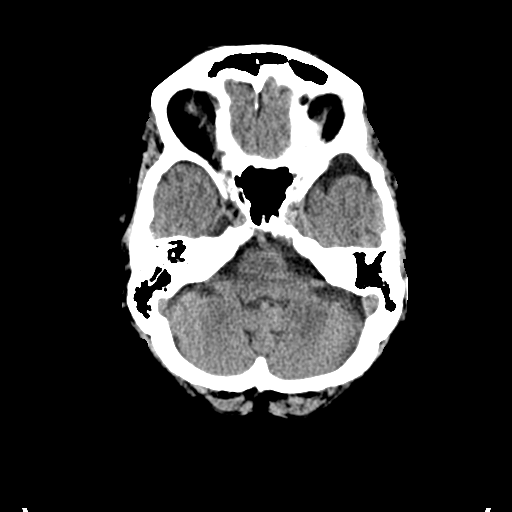
[im 8/32  brain]
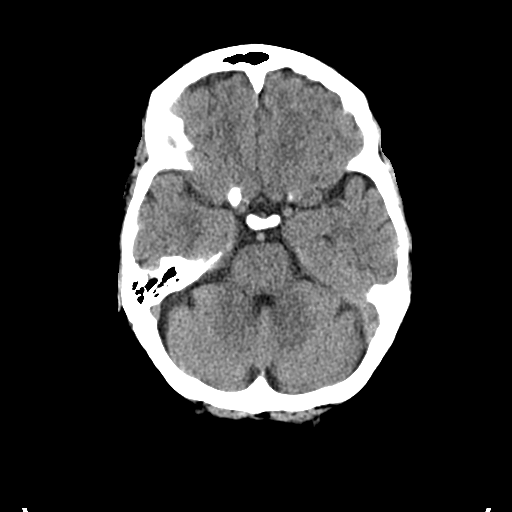
[im 9/32  brain]
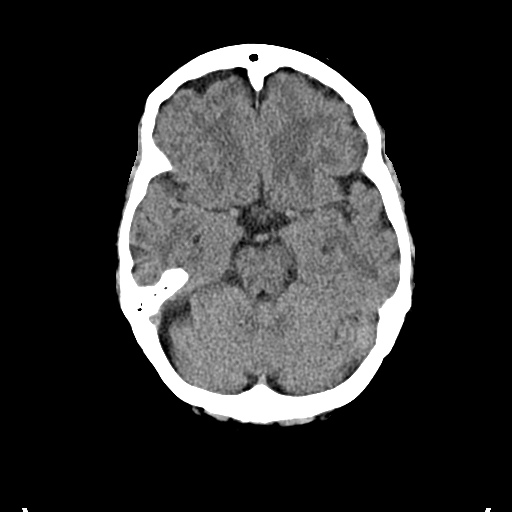
[im 9/32  bone]
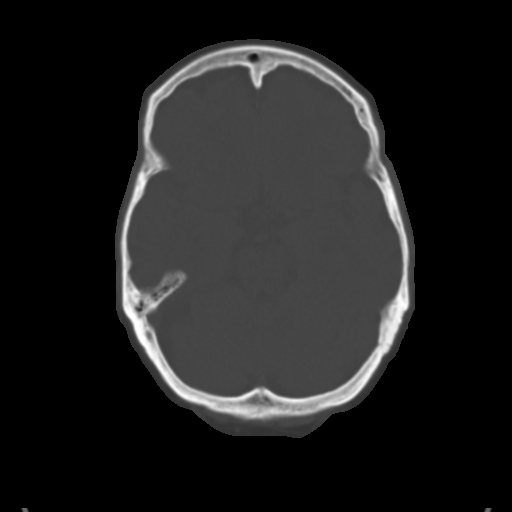
[im 11/32  brain]
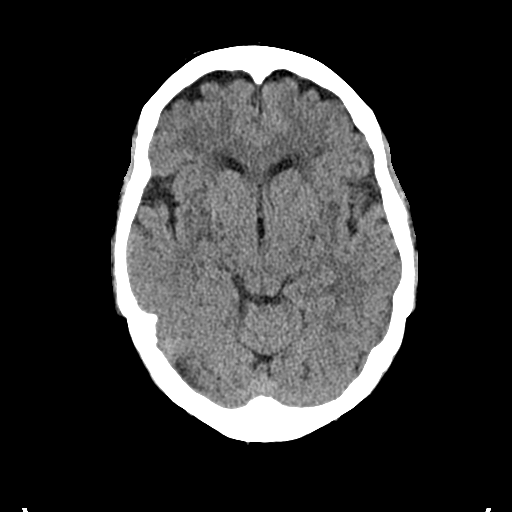
[im 13/32  brain]
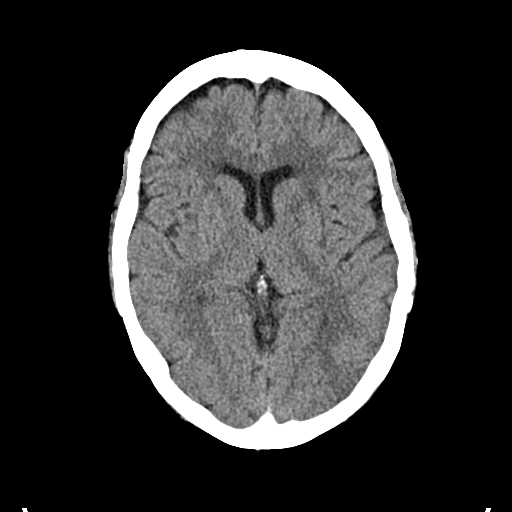
[im 15/32  brain]
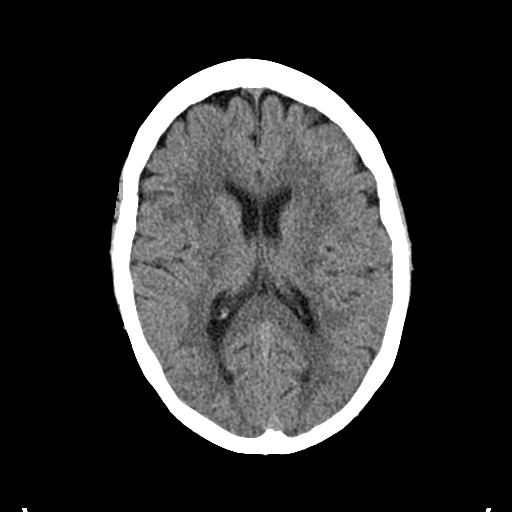
[im 17/32  brain]
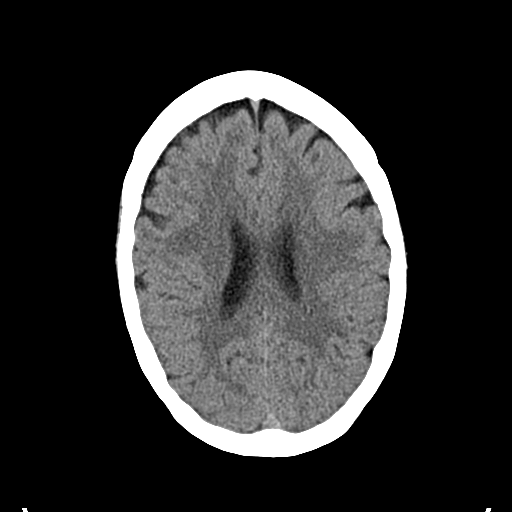
[im 17/32  bone]
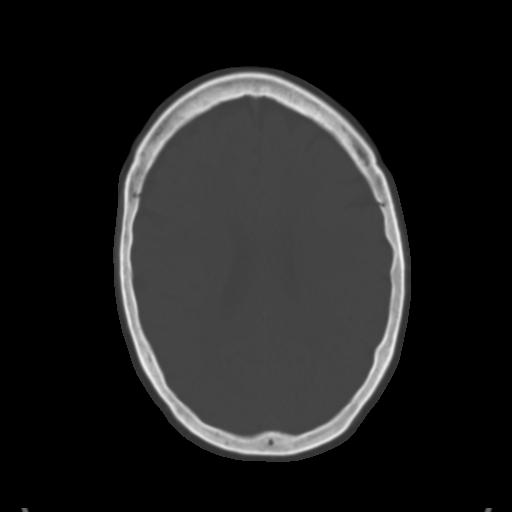
[im 19/32  brain]
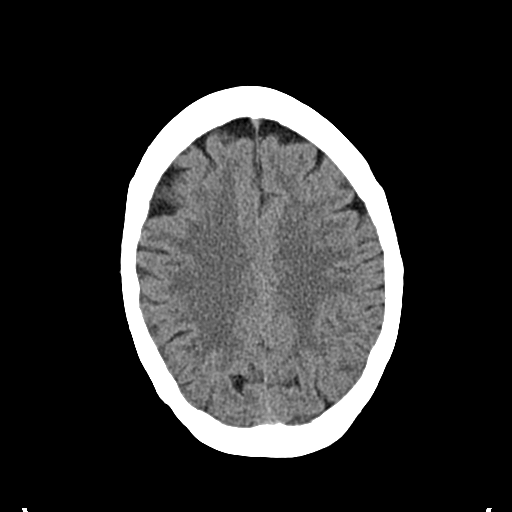
[im 21/32  brain]
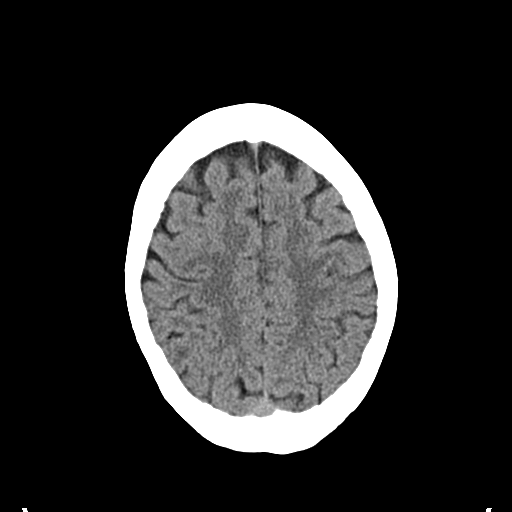
[im 23/32  brain]
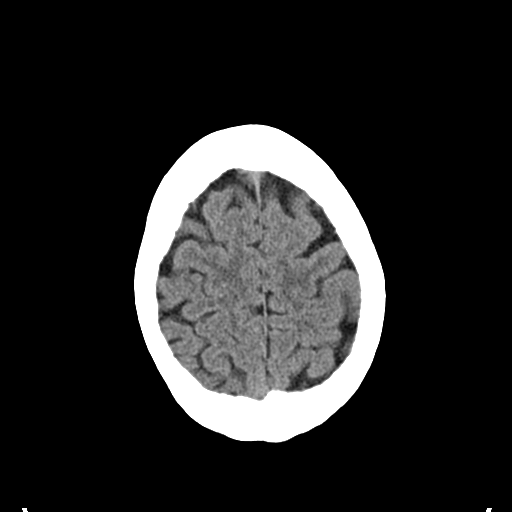
[im 24/32  brain]
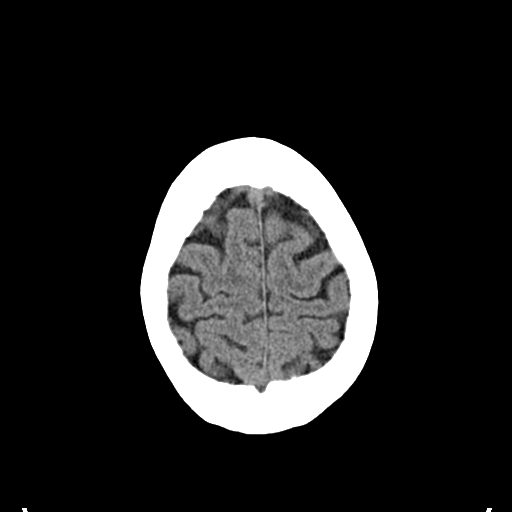
[im 24/32  bone]
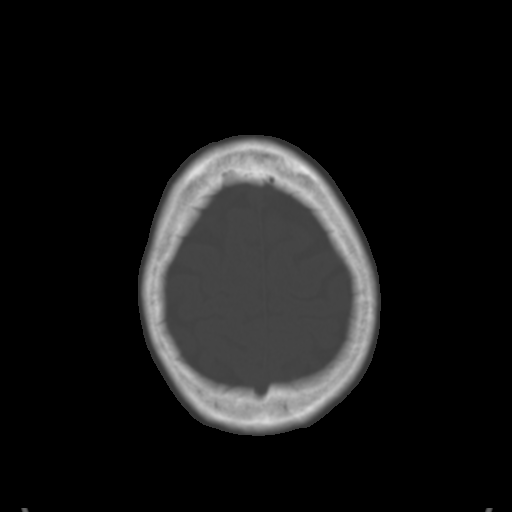
[im 26/32  brain]
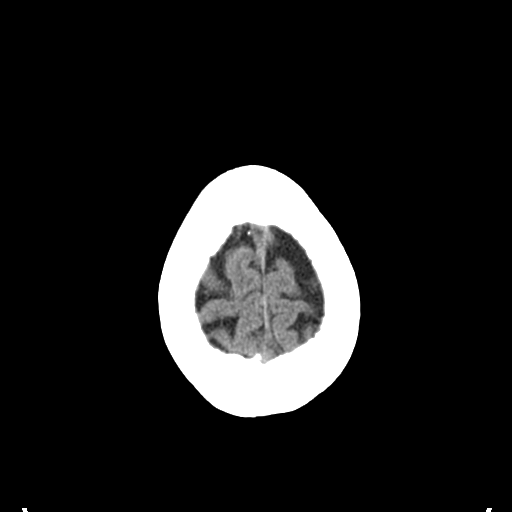
[im 28/32  brain]
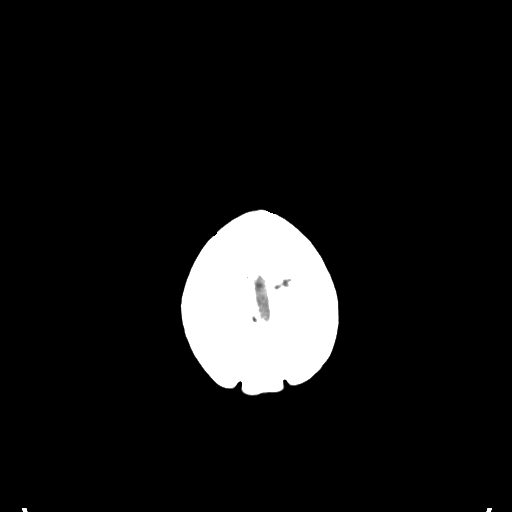
[im 30/32  brain]
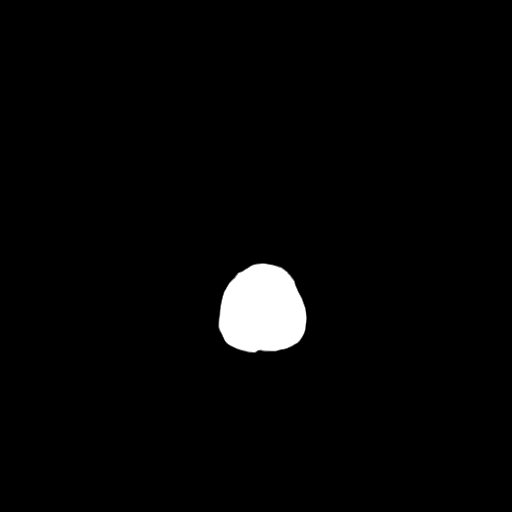

[16 of 30 positions shown; findings below may reference images not displayed]

FINDINGS: Diffuse parenchymal atrophy. Patchy areas of hypoattenuation in deep
and periventricular white matter bilaterally. Negative for acute
intracranial hemorrhage, mass lesion, acute infarction, midline
shift, or mass-effect. Acute infarct may be inapparent on
noncontrast CT. Ventricles and sulci symmetric. Bone windows
demonstrate no focal lesion.
IMPRESSION: 1. Negative for bleed or other acute intracranial process.
2. Atrophy and nonspecific white matter changes.

## 2017-04-01 ENCOUNTER — Encounter: Payer: Self-pay | Admitting: Neurology

## 2017-04-01 ENCOUNTER — Ambulatory Visit (INDEPENDENT_AMBULATORY_CARE_PROVIDER_SITE_OTHER): Payer: Medicare Other | Admitting: Neurology

## 2017-04-01 ENCOUNTER — Telehealth: Payer: Self-pay

## 2017-04-01 VITALS — BP 119/61 | HR 63 | Ht 59.0 in | Wt 163.2 lb

## 2017-04-01 DIAGNOSIS — E083493 Diabetes mellitus due to underlying condition with severe nonproliferative diabetic retinopathy without macular edema, bilateral: Secondary | ICD-10-CM

## 2017-04-01 DIAGNOSIS — G458 Other transient cerebral ischemic attacks and related syndromes: Secondary | ICD-10-CM

## 2017-04-01 DIAGNOSIS — E785 Hyperlipidemia, unspecified: Secondary | ICD-10-CM

## 2017-04-01 NOTE — Telephone Encounter (Signed)
See Dr. Pearlean BrownieSethi message below:

## 2017-04-01 NOTE — Telephone Encounter (Signed)
I called the patient's listed home number and left a voicemail explaining in detail that the purpose of listing diabetes as the diagnosis was only for ordering the hemoglobin A1c and that the patient does not have diabetes and the patient's consult note does not include this diagnosis on the problem list. She was advised to call me if she had questions

## 2017-04-01 NOTE — Patient Instructions (Signed)
I had a long d/w patient about her recent TI, atrial fibrillation, risk for recurrent stroke/TIAs, personally independently reviewed imaging studies and stroke evaluation results and answered questions.Continue Eliquis (apixaban) daily  for secondary stroke prevention  As there is no data to support switching to xarelto, Pradaxa or Syvasa is any better and maintain strict control of hypertension with blood pressure goal below 130/90, diabetes with hemoglobin A1c goal below 6.5% and lipids with LDL cholesterol goal below 70 mg/dL. I also advised the patient to eat a healthy diet with plenty of whole grains, cereals, fruits and vegetables, exercise regularly and maintain ideal body weight . Check MRI scan of the brain with MRA of the brain and neck and lipid profile and hemoglobin A1c. Followup in the future with me in 2 months or call earlier if necessary  Stroke Prevention Some medical conditions and behaviors are associated with an increased chance of having a stroke. You may prevent a stroke by making healthy choices and managing medical conditions. How can I reduce my risk of having a stroke?  Stay physically active. Get at least 30 minutes of activity on most or all days.  Do not smoke. It may also be helpful to avoid exposure to secondhand smoke.  Limit alcohol use. Moderate alcohol use is considered to be: ? No more than 2 drinks per day for men. ? No more than 1 drink per day for nonpregnant women.  Eat healthy foods. This involves: ? Eating 5 or more servings of fruits and vegetables a day. ? Making dietary changes that address high blood pressure (hypertension), high cholesterol, diabetes, or obesity.  Manage your cholesterol levels. ? Making food choices that are high in fiber and low in saturated fat, trans fat, and cholesterol may control cholesterol levels. ? Take any prescribed medicines to control cholesterol as directed by your health care provider.  Manage your  diabetes. ? Controlling your carbohydrate and sugar intake is recommended to manage diabetes. ? Take any prescribed medicines to control diabetes as directed by your health care provider.  Control your hypertension. ? Making food choices that are low in salt (sodium), saturated fat, trans fat, and cholesterol is recommended to manage hypertension. ? Ask your health care provider if you need treatment to lower your blood pressure. Take any prescribed medicines to control hypertension as directed by your health care provider. ? If you are 85-52 years of age, have your blood pressure checked every 3-5 years. If you are 10 years of age or older, have your blood pressure checked every year.  Maintain a healthy weight. ? Reducing calorie intake and making food choices that are low in sodium, saturated fat, trans fat, and cholesterol are recommended to manage weight.  Stop drug abuse.  Avoid taking birth control pills. ? Talk to your health care provider about the risks of taking birth control pills if you are over 54 years old, smoke, get migraines, or have ever had a blood clot.  Get evaluated for sleep disorders (sleep apnea). ? Talk to your health care provider about getting a sleep evaluation if you snore a lot or have excessive sleepiness.  Take medicines only as directed by your health care provider. ? For some people, aspirin or blood thinners (anticoagulants) are helpful in reducing the risk of forming abnormal blood clots that can lead to stroke. If you have the irregular heart rhythm of atrial fibrillation, you should be on a blood thinner unless there is a good reason you cannot take  them. ? Understand all your medicine instructions.  Make sure that other conditions (such as anemia or atherosclerosis) are addressed. Get help right away if:  You have sudden weakness or numbness of the face, arm, or leg, especially on one side of the body.  Your face or eyelid droops to one  side.  You have sudden confusion.  You have trouble speaking (aphasia) or understanding.  You have sudden trouble seeing in one or both eyes.  You have sudden trouble walking.  You have dizziness.  You have a loss of balance or coordination.  You have a sudden, severe headache with no known cause.  You have new chest pain or an irregular heartbeat. Any of these symptoms may represent a serious problem that is an emergency. Do not wait to see if the symptoms will go away. Get medical help at once. Call your local emergency services (911 in [place] Do not drive yourself to the hospital. This information is not intended to replace advice given to you by your health care provider. Make sure you discuss any questions you have with your health care provider. Document Released: 08/27/2004 Document Revised: 12/26/2015 Document Reviewed: 01/20/2013 Elsevier Interactive Patient Education  2017 [employer]

## 2017-04-01 NOTE — Progress Notes (Signed)
[facility] [address] [place]. [place] [address] [phone]       OFFICE CONSULT NOTE  Megan. Megan Riley Date of Birth:  01-12-1941 Medical Record Number:  [id number]   Referring MD:  Derek Jack Reason for Referral:  TIA HPI: Megan Riley is a pleasant 27 year Caucasian lady who had an 10 minute episode of transient left upper extremity numbness and tingling on 02/15/17. The patient is a good historian and able to describe the episode in detail. She stated that she was driving when she noticed that she was tired and had heart rates racing. She stopped at a drug store and took a blood pressure was around 180s/100. She is having some palpitations. Subsequently she developed tingling and numbness in the left arm from the shoulder down involving the whole hand. This lasted barely 10 minutes. She was able to drive home and rested. She states she did not have any speech difficulties or weakness or gait or balance problems. She called her medical doctor the next day. Who saw her in the clinic and felt she may have had a TIA. No recent imaging study or lab work has been ordered so far The patient has a history of paroxysmal atrial fibrillation and has been on eliquis since August 2017 when she was seen at [facility] with the TIA as well. I do not have access to those records. She has no history of hyperlipidemia but has had statin intolerance with multiple agents in the past. She has been taking Zetia for the last few months only. She does not smoke and states her blood pressure is quite good. She is tolerating eliquis well without bleeding or bruising. She has no new complaints today. ROS:   14 system review of systems is positive for  not enough sleep, insomnia, tingling, numbness in all the systems negative  PMH:  Past Medical History:  Diagnosis Date  . Arthritis   . Kidney stones   . Stroke (HCC)   . TIA (transient ischemic attack)     Social History:  Social  History   Social History  . Marital status: Widowed    Spouse name: N/A  . Number of children: N/A  . Years of education: N/A   Occupational History  . Not on file.   Social History Main Topics  . Smoking status: Never Smoker  . Smokeless tobacco: Never Used  . Alcohol use No  . Drug use: No  . Sexual activity: Not on file   Other Topics Concern  . Not on file   Social History Narrative  . No narrative on file    Medications:   Current Outpatient Prescriptions on File Prior to Visit  Medication Sig Dispense Refill  . Ranitidine HCl (ZANTAC PO) Take by mouth.     No current facility-administered medications on file prior to visit.     Allergies:   Allergies  Allergen Reactions  . Phentermine Other (See Comments)    "Loopy" and slurred speech  . Reglan [Metoclopramide] Itching  . Statins     age  . Hydromorphone Itching  . Meperidine Nausea And Vomiting  . Oxycodone-Aspirin Itching    Physical Exam General: Pleasant elderly Caucasian lady seated, in no evident distress Head: head normocephalic and atraumatic.   Neck: supple with no carotid or supraclavicular bruits Cardiovascular: regular rate and rhythm, no murmurs Musculoskeletal: no deformity Skin:  no rash/petichiae Vascular:  Normal pulses all extremities  Neurologic Exam Mental Status: Awake and  fully alert. Oriented to place and time. Recent and remote memory intact. Attention span, concentration and fund of knowledge appropriate. Mood and affect appropriate.  Cranial Nerves: Fundoscopic exam reveals sharp disc margins. Pupils equal, briskly reactive to light. Extraocular movements full without nystagmus. Visual fields full to confrontation. Hearing intact. Facial sensation intact. Face, tongue, palate moves normally and symmetrically.  Motor: Normal bulk and tone. Normal strength in all tested extremity muscles. Sensory.: intact to touch , pinprick , position and vibratory sensation.  Coordination:  Rapid alternating movements normal in all extremities. Finger-to-nose and heel-to-shin performed accurately bilaterally. Gait and Station: Arises from chair without difficulty. Stance is normal. Gait demonstrates normal stride length and balance . Able to heel, toe and tandem walk without difficulty.  Reflexes: 1+ and symmetric. Toes downgoing.   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 Also  maintain strict control of hypertension with blood pressure goal below 130/90, diabetes with hemoglobin A1c goal below 6.5% and lipids with LDL cholesterol goal below 70 mg/dL. I also advised the patient to eat a healthy diet with plenty of whole grains, cereals, fruits and vegetables, exercise regularly and maintain ideal body weight . Check MRI scan of the brain with MRA of the brain and neck and lipid profile and hemoglobin A1c. Patient has history of statin intolerance with multiple agents in past  and is presently on Zetia but if follow-up lipid profile is unsatisfactory we will switch her to get up out of her. Repatha or Praluent Greater than 50% time during this 45 minute consultation visit was spent on counseling and coordination of care about a TIA, atrial fibrillation and discussion about stroke prevention and treatment options Followup in the future with me in 2 months or call earlier if  necessary Delia HeadyPramod Sethi, MD  [facility] [address] [place], [place] [id number]  Phone [phone] Fax [phone] Note: This document was prepared with digital dictation and possible smart phrase technology. Any transcriptional errors that result from this process are unintentional.

## 2017-04-02 LAB — LIPID PANEL
CHOL/HDL RATIO: 3.8 ratio (ref 0.0–4.4)
CHOLESTEROL TOTAL: 177 mg/dL (ref 100–199)
HDL: 46 mg/dL (ref >39)
LDL CALC: 99 mg/dL (ref 0–99)
Triglycerides: 159 mg/dL — ABNORMAL HIGH (ref 0–149)
VLDL CHOLESTEROL CAL: 32 mg/dL (ref 5–40)

## 2017-04-02 LAB — HEMOGLOBIN A1C
ESTIMATED AVERAGE GLUCOSE: 108 mg/dL
HEMOGLOBIN A1C: 5.4 % (ref 4.8–5.6)

## 2017-04-06 ENCOUNTER — Telehealth: Payer: Self-pay

## 2017-04-06 NOTE — Telephone Encounter (Signed)
-----   Message from Micki RileyPramod S Sethi, MD sent at 04/02/2017  2:25 PM EDT ----- Megan RoachKindly inform the patient that test for diabetes and lipid profile is satisfactory but not optimal and she should stay on Zetia for now and does not need the new injectable medication I talked about

## 2017-04-06 NOTE — Telephone Encounter (Signed)
Rn call patient that her diabetes,and lipid panel was satisfactory, but continue to stay on the zetia. Her triglycerides was 159. NO need for the injectable medication. Pt verbalized understanding. ------

## 2017-04-15 ENCOUNTER — Ambulatory Visit
Admission: RE | Admit: 2017-04-15 | Discharge: 2017-04-15 | Disposition: A | Discharge status: Home / Self Care | Payer: Medicare Other | Source: Ambulatory Visit | Attending: Neurology | Admitting: Neurology

## 2017-04-15 ENCOUNTER — Ambulatory Visit
Admission: RE | Admit: 2017-04-15 | Discharge: 2017-04-15 | Disposition: A | Discharge status: Home / Self Care | Payer: PRIVATE HEALTH INSURANCE | Source: Ambulatory Visit | Attending: Neurology | Admitting: Neurology

## 2017-04-15 DIAGNOSIS — G458 Other transient cerebral ischemic attacks and related syndromes: Secondary | ICD-10-CM

## 2017-04-15 MED ORDER — GADOBENATE DIMEGLUMINE 529 MG/ML IV SOLN
15.0000 mL | Freq: Once | INTRAVENOUS | Status: AC | PRN
  Administered 2017-04-15: 15 mL via INTRAVENOUS

## 2017-04-19 ENCOUNTER — Telehealth: Payer: Self-pay

## 2017-04-19 NOTE — Telephone Encounter (Signed)
-----   Message from Micki Riley, MD sent at 04/18/2017 11:11 AM EDT ----- Joneen Roach inform the patient that MRA of the neck shows no significant narrowing of either carotid arteries in the neck. MRA of the brain shows moderate narrowing of one of the blood vessels towards the back on the left side but no worrisome finding. No need to do anything different at the present time.

## 2017-04-19 NOTE — Telephone Encounter (Signed)
Pt returned RN's call °

## 2017-04-19 NOTE — Telephone Encounter (Signed)
Left vm for patient to call back about MRA of neck results.   Left vm for patient to call back about MRI scan brain results.

## 2017-04-20 NOTE — Telephone Encounter (Signed)
Rn call patient that the MRi brain shows no new or worrisome findings. Mild shrinkage of brain slightly worse than previous MRI brain. Pt verbalized understanding. ------

## 2017-04-20 NOTE — Telephone Encounter (Signed)
Rn call patient about her MRA of neck shows no no significant narrowing of either carotid arteries in the neck. MRA of the brain shows moderate narrowing of one of the blood vessels towards the back on the left side but no worrisome finding. No need to do anything different. Pt verbalized understanding. ------

## 2017-06-07 ENCOUNTER — Encounter: Payer: Self-pay | Admitting: Neurology

## 2017-06-07 ENCOUNTER — Ambulatory Visit (INDEPENDENT_AMBULATORY_CARE_PROVIDER_SITE_OTHER): Payer: Medicare Other | Admitting: Neurology

## 2017-06-07 VITALS — BP 142/75 | HR 64 | Wt 165.0 lb

## 2017-06-07 DIAGNOSIS — G459 Transient cerebral ischemic attack, unspecified: Secondary | ICD-10-CM

## 2017-06-07 NOTE — Patient Instructions (Signed)
I had a long d/w patient about her recent TIA,atrial fibrillation, risk for recurrent stroke/TIAs, personally independently reviewed imaging studies and stroke evaluation results and answered questions.Continue Eliquis (apixaban) daily  for secondary stroke prevention and maintain strict control of hypertension with blood pressure goal below 130/90, diabetes with hemoglobin A1c goal below 6.5% and lipids with LDL cholesterol goal below 70 mg/dL. I also advised the patient to eat a healthy diet with plenty of whole grains, cereals, fruits and vegetables, exercise regularly and maintain ideal body weight Followup in the future with my nurse practitioner in 6 months or call earlier if necessary  Fat and Cholesterol Restricted Diet Getting too much fat and cholesterol in your diet may cause health problems. Following this diet helps keep your fat and cholesterol at normal levels. This can keep you from getting sick. What types of fat should I choose?  Choose monosaturated and polyunsaturated fats. These are found in foods such as olive oil, canola oil, flaxseeds, walnuts, almonds, and seeds.  Eat more omega-3 fats. Good choices include salmon, mackerel, sardines, tuna, flaxseed oil, and ground flaxseeds.  Limit saturated fats. These are in animal products such as meats, butter, and cream. They can also be in plant products such as palm oil, palm kernel oil, and coconut oil.  Avoid foods with partially hydrogenated oils in them. These contain trans fats. Examples of foods that have trans fats are stick margarine, some tub margarines, cookies, crackers, and other baked goods. What general guidelines do I need to follow?  Check food labels. Look for the words "trans fat" and "saturated fat."  When preparing a meal: ? Fill half of your plate with vegetables and green salads. ? Fill one fourth of your plate with whole grains. Look for the word "whole" as the first word in the ingredient list. ? Fill one  fourth of your plate with lean protein foods.  Eat more foods that have fiber, like apples, carrots, beans, peas, and barley.  Eat more home-cooked foods. Eat less at restaurants and buffets.  Limit or avoid alcohol.  Limit foods high in starch and sugar.  Limit fried foods.  Cook foods without frying them. Baking, boiling, grilling, and broiling are all great options.  Lose weight if you are overweight. Losing even a small amount of weight can help your overall health. It can also help prevent diseases such as diabetes and heart disease. What foods can I eat? Grains Whole grains, such as whole wheat or whole grain breads, crackers, cereals, and pasta. Unsweetened oatmeal, bulgur, barley, quinoa, or brown rice. Corn or whole wheat flour tortillas. Vegetables Fresh or frozen vegetables (raw, steamed, roasted, or grilled). Green salads. Fruits All fresh, canned (in natural juice), or frozen fruits. Meat and Other Protein Products Ground beef (85% or leaner), grass-fed beef, or beef trimmed of fat. Skinless chicken or [place]. Ground chicken or [place]. Pork trimmed of fat. All fish and seafood. Eggs. Dried beans, peas, or lentils. Unsalted nuts or seeds. Unsalted canned or dry beans. Dairy Low-fat dairy products, such as skim or 1% milk, 2% or reduced-fat cheeses, low-fat ricotta or cottage cheese, or plain low-fat yogurt. Fats and Oils Tub margarines without trans fats. Light or reduced-fat mayonnaise and salad dressings. Avocado. Olive, canola, sesame, or safflower oils. Natural peanut or almond butter (choose ones without added sugar and oil). The items listed above may not be a complete list of recommended foods or beverages. Contact your dietitian for more options. What foods are not recommended?  Grains White bread. White pasta. White rice. Cornbread. Bagels, pastries, and croissants. Crackers that contain trans fat. Vegetables White potatoes. Corn. Creamed or fried vegetables.  Vegetables in a cheese sauce. Fruits Dried fruits. Canned fruit in light or heavy syrup. Fruit juice. Meat and Other Protein Products Fatty cuts of meat. Ribs, chicken wings, bacon, sausage, bologna, salami, chitterlings, fatback, hot dogs, bratwurst, and packaged luncheon meats. Liver and organ meats. Dairy Whole or 2% milk, cream, half-and-half, and cream cheese. Whole milk cheeses. Whole-fat or sweetened yogurt. Full-fat cheeses. Nondairy creamers and whipped toppings. Processed cheese, cheese spreads, or cheese curds. Sweets and Desserts Corn syrup, sugars, honey, and molasses. Candy. Jam and jelly. Syrup. Sweetened cereals. Cookies, pies, cakes, donuts, muffins, and ice cream. Fats and Oils Butter, stick margarine, lard, shortening, ghee, or bacon fat. Coconut, palm kernel, or palm oils. Beverages Alcohol. Sweetened drinks (such as sodas, lemonade, and fruit drinks or punches). The items listed above may not be a complete list of foods and beverages to avoid. Contact your dietitian for more information. This information is not intended to replace advice given to you by your health care provider. Make sure you discuss any questions you have with your health care provider. Document Released: 01/19/2012 Document Revised: 03/26/2016 Document Reviewed: 10/19/2013 Elsevier Interactive Patient Education  [employer].

## 2017-06-07 NOTE — Progress Notes (Signed)
[facility] [address] [place].  [address] [phone]       OFFICE FOLLOW UP VISIt NOTE  Megan. Megan Riley Date of Birth:  1940-08-11 Medical Record Number:  [id number]   Referring MD:  Derek Jack Reason for Referral:  TIA [id number] consult 04/01/2017: Megan Riley is a pleasant 27 year Caucasian lady who had an 10 minute episode of transient left upper extremity numbness and tingling on 02/15/17. The patient is a good historian and able to describe the episode in detail. She stated that she was driving when she noticed that she was tired and had heart rates racing. She stopped at a drug store and took a blood pressure was around 180s/100. She is having some palpitations. Subsequently she developed tingling and numbness in the left arm from the shoulder down involving the whole hand. This lasted barely 10 minutes. She was able to drive home and rested. She states she did not have any speech difficulties or weakness or gait or balance problems. She called her medical doctor the next day. Who saw her in the clinic and felt she may have had a TIA. No recent imaging study or lab work has been ordered so far The patient has a history of paroxysmal atrial fibrillation and has been on eliquis since August 2017 when she was seen at [facility] with the TIA as well. I do not have access to those records. She has no history of hyperlipidemia but has had statin intolerance with multiple agents in the past. She has been taking Zetia for the last few months only. She does not smoke and states her blood pressure is quite good. She is tolerating eliquis well without bleeding or bruising. She has no new complaints today. Update 06/07/2017 : She returns for follow-up after initial consultation 2 months ago. She is accompanied by her friend Avneet. She states she's done well and has not had any recurrent TIA or strokelike symptoms. She is tolerating eliquis well without bleeding or  bruising. She had lab work done at last visit which showed LDL cholesterol 99 mg percent total cholesterol of 177 and HDL 46 mg percent. Triglycerides 156. Hemoglobin A1c was normal at 5.4. MRI scan of the brain done on 9/14 518 personally reviewed by me shows mild age-related generalized cerebral atrophy and white matter changes. These results slightly progressed compared with previous MRI. MRA of the brain showed moderate narrowing of the left posterior cerebral artery but no large vessel stenosis or occlusion. MRA of the neck showed no significant stenosis. Patient states her blood pressure is under good control with rate is borderline in office at 142/5. She has not had any new problems or new complaints since last visit she continues to live independently at home and has no restriction in activities of daily living ROS:   14 system review of systems is positive for  not enough sleep, fatigue, cough, apnea, numbness in all the systems negative  PMH:  Past Medical History:  Diagnosis Date  . Arthritis   . Kidney stones   . Stroke (HCC)   . TIA (transient ischemic attack)     Social History:  Social History   Socioeconomic History  . Marital status: Widowed    Spouse name: Not on file  . Number of children: Not on file  . Years of education: Not on file  . Highest education level: Not on file  Social Needs  . Financial resource strain: Not on file  . Food  insecurity - worry: Not on file  . Food insecurity - inability: Not on file  . Transportation needs - medical: Not on file  . Transportation needs - non-medical: Not on file  Occupational History  . Not on file  Tobacco Use  . Smoking status: Never Smoker  . Smokeless tobacco: Never Used  Substance and Sexual Activity  . Alcohol use: No  . Drug use: No  . Sexual activity: Not on file  Other Topics Concern  . Not on file  Social History Narrative  . Not on file    Medications:   Current Outpatient Medications on File  Prior to Visit  Medication Sig Dispense Refill  . Ascorbic Acid (VITAMIN C) 500 MG CAPS Take by mouth.    Marland Kitchen. atenolol (TENORMIN) 25 MG tablet Take daily by mouth.    . Calcium Carb-Cholecalciferol (CALCIUM-VITAMIN D) 500-200 MG-UNIT tablet Take by mouth.    Everlene Balls. ELIQUIS 5 MG TABS tablet     . esomeprazole (NEXIUM) 20 MG capsule Take 20 mg by mouth.    . ezetimibe (ZETIA) 10 MG tablet     . Multiple Vitamin (MULTI-VITAMINS) TABS Take by mouth.    . Ranitidine HCl (ZANTAC PO) Take as needed by mouth.      No current facility-administered medications on file prior to visit.     Allergies:   Allergies  Allergen Reactions  . Phentermine Other (See Comments)    "Loopy" and slurred speech  . Reglan [Metoclopramide] Itching  . Statins     age  . Hydromorphone Itching  . Meperidine Nausea And Vomiting  . Oxycodone-Aspirin Itching    Physical Exam General: Pleasant elderly Caucasian lady seated, in no evident distress Head: head normocephalic and atraumatic.   Neck: supple with no carotid or supraclavicular bruits Cardiovascular: regular rate and rhythm, no murmurs Musculoskeletal: no deformity Skin:  no rash/petichiae Vascular:  Normal pulses all extremities  Neurologic Exam Mental Status: Awake and fully alert. Oriented to place and time. Recent and remote memory intact. Attention span, concentration and fund of knowledge appropriate. Mood and affect appropriate.  Cranial Nerves: Fundoscopic exam reveals sharp disc margins. Pupils equal, briskly reactive to light. Extraocular movements full without nystagmus. Visual fields full to confrontation. Hearing intact. Facial sensation intact. Face, tongue, palate moves normally and symmetrically.  Motor: Normal bulk and tone. Normal strength in all tested extremity muscles. Sensory.: intact to touch , pinprick , position and vibratory sensation.  Coordination: Rapid alternating movements normal in all extremities. Finger-to-nose and  heel-to-shin performed accurately bilaterally. Gait and Station: Arises from chair without difficulty. Stance is normal. Gait demonstrates normal stride length and balance . Able to heel, toe and tandem walk without difficulty.  Reflexes: 1+ and symmetric. Toes downgoing.       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 Also  maintain strict control of hypertension with blood pressure goal below 130/90, diabetes with hemoglobin A1c goal below 6.5% and lipids with LDL cholesterol goal below 70 mg/dL. I also advised the patient to eat a healthy diet with plenty of whole grains, cereals, fruits and vegetables, exercise regularly and maintain ideal  body weight . Check MRI scan of the brain with MRA of the brain and neck and lipid profile and hemoglobin A1c. Patient has history of statin intolerance with multiple agents in past  and is presently on Zetia and I have advised her to be on a strict low-fat diet Greater than 50% time during this 25 minute  visit was spent on counseling and coordination of care about a TIA, atrial fibrillation and discussion about stroke prevention and treatment options Followup in the future with me in 6 months with my nurse practitioner or call earlier if necessary Delia Heady, MD  [facility] [address] [place], [place] [address]  Phone [phone] Fax [phone] Note: This document was prepared with digital dictation and possible smart phrase technology. Any transcriptional errors that  result from this process are unintentional.

## 2017-12-06 ENCOUNTER — Encounter: Payer: Self-pay | Admitting: Neurology

## 2017-12-06 ENCOUNTER — Ambulatory Visit (INDEPENDENT_AMBULATORY_CARE_PROVIDER_SITE_OTHER): Payer: Medicare Other | Admitting: Neurology

## 2017-12-06 VITALS — BP 138/57 | HR 78 | Ht 59.0 in | Wt 151.8 lb

## 2017-12-06 DIAGNOSIS — I6990 Unspecified sequelae of unspecified cerebrovascular disease: Secondary | ICD-10-CM

## 2017-12-06 NOTE — Progress Notes (Signed)
[facility] [address] [place]. [place] [address] [phone]       OFFICE FOLLOW UP VISIt NOTE  Megan. Megan Riley Date of Birth:  13-Apr-1941 Medical Record Number:  [id number]   Referring MD:  Derek Jack Reason for Referral:  TIA ION:[id number] consult 04/01/2017: Megan Riley is a pleasant 41 year Caucasian lady who had an 10 minute episode of transient left upper extremity numbness and tingling on 02/15/17. The patient is a good historian and able to describe the episode in detail. She stated that she was driving when she noticed that she was tired and had heart rates racing. She stopped at a drug store and took a blood pressure was around 180s/100. She is having some palpitations. Subsequently she developed tingling and numbness in the left arm from the shoulder down involving the whole hand. This lasted barely 10 minutes. She was able to drive home and rested. She states she did not have any speech difficulties or weakness or gait or balance problems. She called her medical doctor the next day. Who saw her in the clinic and felt she may have had a TIA. No recent imaging study or lab work has been ordered so far The patient has a history of paroxysmal atrial fibrillation and has been on eliquis since August 2017 when she was seen at [facility] with the TIA as well. I do not have access to those records. She has no history of hyperlipidemia but has had statin intolerance with multiple agents in the past. She has been taking Zetia for the last few months only. She does not smoke and states her blood pressure is quite good. She is tolerating eliquis well without bleeding or bruising. She has no new complaints today. Update 06/07/2017 : She returns for follow-up after initial consultation 2 months ago. She is accompanied by her friend Tayleigh. She states she's done well and has not had any recurrent TIA or strokelike symptoms. She is tolerating eliquis well without bleeding or  bruising. She had lab work done at last visit which showed LDL cholesterol 99 mg percent total cholesterol of 177 and HDL 46 mg percent. Triglycerides 156. Hemoglobin A1c was normal at 5.4. MRI scan of the brain done on 9/14 518 personally reviewed by me shows mild age-related generalized cerebral atrophy and white matter changes. These results slightly progressed compared with previous MRI. MRA of the brain showed moderate narrowing of the left posterior cerebral artery but no large vessel stenosis or occlusion. MRA of the neck showed no significant stenosis. Patient states her blood pressure is under good control with rate is borderline in office at 142/5. She has not had any new problems or new complaints since last visit she continues to live independently at home and has no restriction in activities of daily living Update 12/06/2017 :she returns for follow-up after last visit 6 months ago. She continues to well and has not had recurrent stroke or TIA symptoms. He remains on eliquis which is tolerating well without bleeding but does get minor bruising. She had a fall in February when she tripped on her steps. She had some bruising but fortunately no major bleeding. She states her blood pressure is well controlled and today it is 138/57. She had lipid profile checked a few months ago and it was unsatisfactory. She has history of statin intolerance and her cardiologist Dr. Heron Nay plans to prescribe Praluent   but is awaiting insurance approval. She has no new complaints today. ROS:  14 system review of systems is positive for  No complaints todayall the systems negative  PMH:  Past Medical History:  Diagnosis Date  . Arthritis   . Kidney stones   . Stroke (HCC)   . TIA (transient ischemic attack)     Social History:  Social History   Socioeconomic History  . Marital status: Widowed    Spouse name: Not on file  . Number of children: Not on file  . Years of education: Not on file  . Highest  education level: Not on file  Occupational History  . Not on file  Social Needs  . Financial resource strain: Not on file  . Food insecurity:    Worry: Not on file    Inability: Not on file  . Transportation needs:    Medical: Not on file    Non-medical: Not on file  Tobacco Use  . Smoking status: Never Smoker  . Smokeless tobacco: Never Used  Substance and Sexual Activity  . Alcohol use: No  . Drug use: No  . Sexual activity: Not on file  Lifestyle  . Physical activity:    Days per week: Not on file    Minutes per session: Not on file  . Stress: Not on file  Relationships  . Social connections:    Talks on phone: Not on file    Gets together: Not on file    Attends religious service: Not on file    Active member of club or organization: Not on file    Attends meetings of clubs or organizations: Not on file    Relationship status: Not on file  . Intimate partner violence:    Fear of current or ex partner: Not on file    Emotionally abused: Not on file    Physically abused: Not on file    Forced sexual activity: Not on file  Other Topics Concern  . Not on file  Social History Narrative  . Not on file    Medications:   Current Outpatient Medications on File Prior to Visit  Medication Sig Dispense Refill  . Alirocumab 75 MG/ML SOPN Inject into the skin.    . Ascorbic Acid (VITAMIN C) 500 MG CAPS Take by mouth.    Marland Kitchen atenolol (TENORMIN) 25 MG tablet Take daily by mouth.    . Calcium Carb-Cholecalciferol (CALCIUM-VITAMIN D) 500-200 MG-UNIT tablet Take by mouth.    Everlene Balls 5 MG TABS tablet     . esomeprazole (NEXIUM) 20 MG capsule Take 20 mg by mouth.    . Multiple Vitamin (MULTI-VITAMINS) TABS Take by mouth.    . Ranitidine HCl (ZANTAC PO) Take as needed by mouth.      No current facility-administered medications on file prior to visit.     Allergies:   Allergies  Allergen Reactions  . Phentermine Other (See Comments)    "Loopy" and slurred speech  . Reglan  [Metoclopramide] Itching  . Statins Other (See Comments)    age age age   . Hydromorphone Itching  . Meperidine Nausea And Vomiting  . Oxycodone-Aspirin Itching    Physical Exam General: Pleasant elderly Caucasian lady seated, in no evident distress Head: head normocephalic and atraumatic.   Neck: supple with no carotid or supraclavicular bruits Cardiovascular: regular rate and rhythm, no murmurs Musculoskeletal: no deformity Skin:  no rash/petichiae Vascular:  Normal pulses all extremities  Neurologic Exam Mental Status: Awake and fully alert. Oriented to place and time. Recent and remote memory intact. Attention  span, concentration and fund of knowledge appropriate. Mood and affect appropriate.  Cranial Nerves: Fundoscopic exam not done. Pupils equal, briskly reactive to light. Extraocular movements full without nystagmus. Visual fields full to confrontation. Hearing intact. Facial sensation intact. Face, tongue, palate moves normally and symmetrically.  Motor: Normal bulk and tone. Normal strength in all tested extremity muscles. Sensory.: intact to touch , pinprick , position and vibratory sensation.  Coordination: Rapid alternating movements normal in all extremities. Finger-to-nose and heel-to-shin performed accurately bilaterally. Gait and Station: Arises from chair without difficulty. Stance is normal. Gait demonstrates normal stride length and balance . Able to heel, toe and tandem walk with familydifficulty.  Reflexes: 1+ and symmetric. Toes downgoing.       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 Continue eliquis for stroke prevention and strict control of hypertension with blood pressure goal below  130/90 and lipids with LDL cholesterol goal below 70 mg percent. I advised her to follow-up with a cardiologist to start Praluent or Repatha injections since he has statin intolerance. She will return for follow-up in a year to see my NP Shanda Bumps or call earlier if necessary. Greater than 50% time during this 25 minute  visit was spent on counseling and coordination of care about a TIA, atrial fibrillation ,statin intolerance and discussion about stroke prevention and treatment options Followup in the future with me in 6 months with my nurse practitioner or call earlier if necessary Delia Heady, MD  [facility] [address] [place], [place] [address]  Phone [phone] Fax [phone] Note: This document was prepared with digital dictation and possible smart phrase technology. Any transcriptional errors that result from this process are unintentional.

## 2017-12-06 NOTE — Patient Instructions (Addendum)
I had a long discussion with the patient regarding her remote stroke and atrial fibrillation. Continue eliquis for stroke prevention and strict control of hypertension with blood pressure goal below 130/90 and lipids with LDL cholesterol goal below 70 mg percent. I advised her to follow-up with a cardiologist to start Praluent or Repatha injections since he has statin intolerance. She will return for follow-up in a year to see my NP Shanda Bumps or call earlier if necessary.

## 2018-12-06 ENCOUNTER — Telehealth: Payer: Self-pay

## 2018-12-06 NOTE — Telephone Encounter (Signed)
I called pt that her visit on 12/08/2018 will be change to video due to covid 19.Verbal consent obtained and to file insurance. Pt stated her PCP retired forget the name of her new provider. She will call us back with her new PCP. I stated provider will text her link to cell phone that has a camera on it. She will  click the link  5-10 minutes before your appointment time. You will be asked to enter your name (First and Last) and then it will put you into Jessica NP  "waiting room." Shanda Bumps NP should be with you at your scheduled appointment time, but please be patient because she may be running a few minutes behind. She verbalized understanding.

## 2018-12-08 ENCOUNTER — Other Ambulatory Visit: Payer: Self-pay

## 2018-12-08 ENCOUNTER — Encounter: Payer: Self-pay | Admitting: Adult Health

## 2018-12-08 ENCOUNTER — Ambulatory Visit (INDEPENDENT_AMBULATORY_CARE_PROVIDER_SITE_OTHER): Payer: Medicare Other | Admitting: Adult Health

## 2018-12-08 DIAGNOSIS — E785 Hyperlipidemia, unspecified: Secondary | ICD-10-CM

## 2018-12-08 DIAGNOSIS — G459 Transient cerebral ischemic attack, unspecified: Secondary | ICD-10-CM

## 2018-12-08 DIAGNOSIS — Z789 Other specified health status: Secondary | ICD-10-CM | POA: Diagnosis not present

## 2018-12-08 DIAGNOSIS — I10 Essential (primary) hypertension: Secondary | ICD-10-CM

## 2018-12-08 NOTE — Progress Notes (Signed)
I agree with the above plan 

## 2018-12-08 NOTE — Progress Notes (Signed)
[facility] [address] [place]. [place] [id number] [phone]       FOLLOW UP VISIt NOTE  Ms. Megan NegusLinda Riley Date of Birth:  19-Dec-1940 Medical Record Number:  [id number]   Referring MD:  Derek JackAldene Hawks Reason for Referral:  TIA  Virtual Visit via Video Note  I connected with Megan Riley on 12/08/18 at  2:45 PM EDT by a video enabled telemedicine application located remotely in my own home and verified that I am speaking with the correct person using two identifiers who was located at their own home.   Visit scheduled by Jeannene PatellaKatrina Murrel, RN. I discussed the limitations of evaluation and management by telemedicine and the availability of in person appointments. The patient expressed understanding and agreed to proceed.   HPI:  Megan Riley is a 78 y.o. female who is being followed in this office after right hemispheric TIA.  She initially had office follow-up scheduled today but due to COVID-19 CT precautions, visit transition to telemedicine via [email or web address] with patient's consent.   She has been stable from a stroke standpoint without recurrent or new stroke/TIA symptoms.  Continues on Eliquis for atrial fibrillation and secondary stroke prevention without bleeding or bruising. Tried Praluent but unable to tolerate due to muscle weakness. Discontinued and symptoms resolved. She is currently on Zetia for HDL management. Recent lipid panel obtained which showed improvement per patient (unable to see labs). Blood pressure stable with occasional flucuations. Has had a couple episodes of dizzy spells but she believes are related to sinuses. Was evaluated by PCP and obtained abx for sinus infection. No further concerns at this time.    ROS:   14 system review of systems is positive for  No complaints todayall the systems negative  PMH:  Past Medical History:  Diagnosis Date  . Arthritis   . Kidney stones   . Stroke (HCC)   . TIA (transient ischemic attack)     Social  History:  Social History   Socioeconomic History  . Marital status: Widowed    Spouse name: Not on file  . Number of children: Not on file  . Years of education: Not on file  . Highest education level: Not on file  Occupational History  . Not on file  Social Needs  . Financial resource strain: Not on file  . Food insecurity:    Worry: Not on file    Inability: Not on file  . Transportation needs:    Medical: Not on file    Non-medical: Not on file  Tobacco Use  . Smoking status: Never Smoker  . Smokeless tobacco: Never Used  Substance and Sexual Activity  . Alcohol use: No  . Drug use: No  . Sexual activity: Not on file  Lifestyle  . Physical activity:    Days per week: Not on file    Minutes per session: Not on file  . Stress: Not on file  Relationships  . Social connections:    Talks on phone: Not on file    Gets together: Not on file    Attends religious service: Not on file    Active member of club or organization: Not on file    Attends meetings of clubs or organizations: Not on file    Relationship status: Not on file  . Intimate partner violence:    Fear of current or ex partner: Not on file    Emotionally abused: Not on file    Physically abused: Not on file  Forced sexual activity: Not on file  Other Topics Concern  . Not on file  Social History Narrative  . Not on file    Medications:   Current Outpatient Medications on File Prior to Visit  Medication Sig Dispense Refill  . Alirocumab 75 MG/ML SOPN Inject into the skin.    . Ascorbic Acid (VITAMIN C) 500 MG CAPS Take by mouth.    Marland Kitchen atenolol (TENORMIN) 25 MG tablet Take daily by mouth.    . Calcium Carb-Cholecalciferol (CALCIUM-VITAMIN D) 500-200 MG-UNIT tablet Take by mouth.    Everlene Balls 5 MG TABS tablet     . esomeprazole (NEXIUM) 20 MG capsule Take 20 mg by mouth.    . Multiple Vitamin (MULTI-VITAMINS) TABS Take by mouth.    . Ranitidine HCl (ZANTAC PO) Take as needed by mouth.      No  current facility-administered medications on file prior to visit.     Allergies:   Allergies  Allergen Reactions  . Phentermine Other (See Comments)    "Loopy" and slurred speech  . Reglan [Metoclopramide] Itching  . Statins Other (See Comments)    age age age   . Hydromorphone Itching  . Meperidine Nausea And Vomiting  . Oxycodone-Aspirin Itching    Physical Exam General: Pleasant elderly Caucasian lady seated, in no evident distress Head: head normocephalic and atraumatic.     Neurologic Exam Mental Status: Awake and fully alert. Oriented to place and time. Recent and remote memory intact. Attention span, concentration and fund of knowledge appropriate. Mood and affect appropriate.  Cranial Nerves: Extraocular movements full without nystagmus. Hearing intact to voice. Facial sensation intact. Face, tongue, palate moves normally and symmetrically.  Motor: No evidence of weakness per drift assessment Sensory.: intact to touch Coordination: Rapid alternating movements normal in all extremities. Finger-to-nose and heel-to-shin performed accurately bilaterally. Gait and Station: Arises from chair without difficulty. Stance is normal. Gait demonstrates normal stride length and balance .  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  She has been stable from a stroke standpoint without recurring or new stroke/TIA symptoms.    PLAN: -Continue Eliquis and Zetia for secondary stroke prevention -Continue to follow with cardiology for Eliquis and atrial fibrillation management -Continue to follow with PCP for HTN and HLD management -Continue to stay active maintain healthy diet -Monitor BP at home -BP goal less than 130/90, LDL goal less than 70, A1c goal less than 7%  -Dizziness sensation likely related to sinuses and advised to follow-up with PCP for possible need of ENT referral if continues  Stable from stroke standpoint and will follow-up as needed  Greater than 50% time during this 25 minute non-face-to-face visit was spent on counseling and coordination of care about a TIA, atrial fibrillation ,statin intolerance and discussion about stroke prevention and treatment options    George Hugh, AGNP-BC  [facility] [address] [place], [place] [address]  Phone [phone] Fax [phone] Note: This document was prepared with digital dictation and possible smart phrase technology. Any transcriptional errors that result from this process are unintentional.

## 2020-08-02 ENCOUNTER — Encounter (HOSPITAL_BASED_OUTPATIENT_CLINIC_OR_DEPARTMENT_OTHER): Payer: Self-pay

## 2020-08-02 ENCOUNTER — Other Ambulatory Visit: Payer: Self-pay

## 2020-08-02 ENCOUNTER — Emergency Department (HOSPITAL_BASED_OUTPATIENT_CLINIC_OR_DEPARTMENT_OTHER): Payer: Medicare Other

## 2020-08-02 ENCOUNTER — Emergency Department (HOSPITAL_BASED_OUTPATIENT_CLINIC_OR_DEPARTMENT_OTHER)
Admission: EM | Admit: 2020-08-02 | Discharge: 2020-08-02 | Disposition: A | Discharge status: Home / Self Care | Payer: Medicare Other | Attending: Emergency Medicine | Admitting: Emergency Medicine

## 2020-08-02 DIAGNOSIS — R5381 Other malaise: Secondary | ICD-10-CM | POA: Diagnosis present

## 2020-08-02 DIAGNOSIS — U071 COVID-19: Secondary | ICD-10-CM

## 2020-08-02 DIAGNOSIS — Z96653 Presence of artificial knee joint, bilateral: Secondary | ICD-10-CM | POA: Insufficient documentation

## 2020-08-02 HISTORY — DX: Unspecified atrial fibrillation: I48.91

## 2020-08-02 LAB — CBC
HCT: 38.9 % (ref 36.0–46.0)
Hemoglobin: 12.7 g/dL (ref 12.0–15.0)
MCH: 29.3 pg (ref 26.0–34.0)
MCHC: 32.6 g/dL (ref 30.0–36.0)
MCV: 89.6 fL (ref 80.0–100.0)
Platelets: 195 10*3/uL (ref 150–400)
RBC: 4.34 MIL/uL (ref 3.87–5.11)
RDW: 13.5 % (ref 11.5–15.5)
WBC: 6 10*3/uL (ref 4.0–10.5)
nRBC: 0 % (ref 0.0–0.2)

## 2020-08-02 LAB — BASIC METABOLIC PANEL
Anion gap: 10 (ref 5–15)
BUN: 8 mg/dL (ref 8–23)
CO2: 24 mmol/L (ref 22–32)
Calcium: 9.1 mg/dL (ref 8.9–10.3)
Chloride: 105 mmol/L (ref 98–111)
Creatinine, Ser: 0.95 mg/dL (ref 0.44–1.00)
GFR, Estimated: >60 mL/min (ref >60)
Glucose, Bld: 99 mg/dL (ref 70–99)
Potassium: 3.7 mmol/L (ref 3.5–5.1)
Sodium: 139 mmol/L (ref 135–145)

## 2020-08-02 NOTE — ED Notes (Signed)
pcxr tbd when room is available- triage or otherwise

## 2020-08-02 NOTE — Discharge Instructions (Addendum)
Call your primary care doctor in the next 1-2 days to arrange video follow-up.  Use a finger pulse oximeter at home.  You may purchase one at [facility] or [facility] or online.  If the numbers drops and stays below 90%, return immediately back to the ER.  Otherwise increase your fluid intake, isolate at home for 5 days after symptoms resolve, and inform recent close contacts of the need to test for Covid.  

## 2020-08-02 NOTE — ED Notes (Signed)
Patient left before discharge vitals and discharge paperwork.

## 2020-08-02 NOTE — ED Provider Notes (Signed)
[facility] EMERGENCY DEPARTMENT Provider Note   CSN: [id number] Arrival date & time: 08/02/20  1608     History Chief Complaint  Patient presents with  . Covid Positive    Megan Riley is a 79 y.o. female.  Patient complaining of body aches generalized malaise and fatigue.  Had fevers yesterday.  Took a home Covid test and was found to be positive and presents to ER for further evaluation.  Denies vomiting, no chest pain or abdominal pain.        Past Medical History:  Diagnosis Date  . A-fib (HCC)   . Arthritis   . Kidney stones   . Stroke (HCC)   . TIA (transient ischemic attack)     There are no problems to display for this patient.   Past Surgical History:  Procedure Laterality Date  . ABDOMINAL HYSTERECTOMY    . APPENDECTOMY    . CHOLECYSTECTOMY    . HERNIA REPAIR    . MASTECTOMY    . OOPHORECTOMY    . TOTAL KNEE ARTHROPLASTY       OB History   No obstetric history on file.     No family history on file.  Social History   Tobacco Use  . Smoking status: Never Smoker  . Smokeless tobacco: Never Used  Vaping Use  . Vaping Use: Never used  Substance Use Topics  . Alcohol use: No  . Drug use: No    Home Medications Prior to Admission medications   Medication Sig Start Date End Date Taking? Authorizing Provider  Ascorbic Acid (VITAMIN C) 500 MG CAPS Take by mouth.    [provider]  atenolol (TENORMIN) 25 MG tablet Take daily by mouth.    [provider]  Calcium Carb-Cholecalciferol (CALCIUM-VITAMIN D) 500-200 MG-UNIT tablet Take by mouth.    [provider]  ELIQUIS 5 MG TABS tablet  03/26/17   [provider]  esomeprazole (NEXIUM) 20 MG capsule Take 20 mg by mouth.    [provider]  ezetimibe (ZETIA) 10 MG tablet Take 10 mg by mouth daily.    [provider]  Multiple Vitamin (MULTI-VITAMINS) TABS Take by mouth.    [provider]  Ranitidine HCl (ZANTAC PO) Take  as needed by mouth.     [provider]    Allergies    Peppermint oil, Phentermine, Reglan [metoclopramide], Statins, Hydromorphone, Meperidine, and Oxycodone-aspirin  Review of Systems   Review of Systems  Constitutional: Positive for fever.  HENT: Negative for ear pain.   Eyes: Negative for pain.  Respiratory: Positive for cough.   Cardiovascular: Negative for chest pain.  Gastrointestinal: Negative for abdominal pain.  Genitourinary: Negative for flank pain.  Musculoskeletal: Negative for back pain.  Skin: Negative for rash.  Neurological: Negative for headaches.    Physical Exam Updated Vital Signs BP 132/78 (BP Location: Right Arm)   Pulse 81   Temp 100.2 F (37.9 C) (Oral)   Resp 20   Ht 4' 11.5" (1.511 m)   Wt 72.6 kg   SpO2 95%   BMI 31.78 kg/m   Physical Exam Constitutional:      General: She is not in acute distress.    Appearance: Normal appearance.  HENT:     Head: Normocephalic.     Nose: Nose normal.  Eyes:     Extraocular Movements: Extraocular movements intact.  Cardiovascular:     Rate and Rhythm: Normal rate.  Pulmonary:     Effort: Pulmonary  effort is normal.  Musculoskeletal:        General: Normal range of motion.     Cervical back: Normal range of motion.  Neurological:     General: No focal deficit present.     Mental Status: She is alert. Mental status is at baseline.     ED Results / Procedures / Treatments   Labs (all labs ordered are listed, but only abnormal results are displayed) Labs Reviewed  BASIC METABOLIC PANEL  CBC    EKG None  Radiology DG Chest Portable 1 View  Result Date: 08/02/2020 CLINICAL DATA:  Cough for 3 days.  Positive home COVID test today. EXAM: PORTABLE CHEST 1 VIEW COMPARISON:  Radiograph 10/07/2019, CT 10/12/2019 FINDINGS: The cardiomediastinal contours are normal. Mild aortic atherosclerosis. Pulmonary vasculature is normal. No consolidation, pleural effusion, or pneumothorax. No acute  osseous abnormalities are seen. Degenerative change throughout the spine with scoliosis. Peripherally calcified breast implants causing mild overlying artifact at the lung bases. IMPRESSION: No acute chest findings. No evidence of pneumonia. Electronically Signed   By: Narda Rutherford M.D.   On: 08/02/2020 17:47    Procedures Procedures (including critical care time)  Medications Ordered in ED Medications - No data to display  ED Course  I have reviewed the triage vital signs and the nursing notes.  Pertinent labs & imaging results that were available during my care of the patient were reviewed by me and considered in my medical decision making (see chart for details).    MDM Rules/Calculators/A&P                          Patient's lab work is unremarkable.  Chest x-ray shows no infiltrate effusion or edema.  Discussed with the patient possibility of receiving monoclonal antibodies to risks and benefits were discussed.  Patient declined the procedure.  She prefers to go home.  Advised her to return immediately if she has trouble breathing or any additional concerns otherwise advised fluid intake to be increased at home rest and isolation until symptoms have resolved and 5 days have passed after that.  Advised immediate return if her home finger pulse oximeter drops below 90% or if she has any worsening symptoms.   Final Clinical Impression(s) / ED Diagnoses Final diagnoses:  COVID-19 virus infection    Rx / DC Orders ED Discharge Orders    None       Cheryll Cockayne, MD 08/02/20 [phone]

## 2020-08-02 NOTE — ED Triage Notes (Signed)
Pt states she tested positive for COVID today on a home test pt states she wanted to get checked out. Pt reports a temp with TMax of 100, cough, body aches, and HA.

## 2020-08-03 ENCOUNTER — Emergency Department (HOSPITAL_BASED_OUTPATIENT_CLINIC_OR_DEPARTMENT_OTHER)
Admission: EM | Admit: 2020-08-03 | Discharge: 2020-08-03 | Disposition: A | Discharge status: Home / Self Care | Payer: Medicare Other | Attending: Emergency Medicine | Admitting: Emergency Medicine

## 2020-08-03 ENCOUNTER — Encounter (HOSPITAL_BASED_OUTPATIENT_CLINIC_OR_DEPARTMENT_OTHER): Payer: Self-pay

## 2020-08-03 DIAGNOSIS — Z96659 Presence of unspecified artificial knee joint: Secondary | ICD-10-CM | POA: Diagnosis not present

## 2020-08-03 DIAGNOSIS — U071 COVID-19: Secondary | ICD-10-CM | POA: Diagnosis not present

## 2020-08-03 DIAGNOSIS — I4891 Unspecified atrial fibrillation: Secondary | ICD-10-CM | POA: Diagnosis not present

## 2020-08-03 DIAGNOSIS — Z8673 Personal history of transient ischemic attack (TIA), and cerebral infarction without residual deficits: Secondary | ICD-10-CM | POA: Diagnosis not present

## 2020-08-03 DIAGNOSIS — Z7901 Long term (current) use of anticoagulants: Secondary | ICD-10-CM | POA: Insufficient documentation

## 2020-08-03 DIAGNOSIS — R059 Cough, unspecified: Secondary | ICD-10-CM | POA: Diagnosis present

## 2020-08-03 NOTE — ED Triage Notes (Signed)
She states she has had uri sx x 3 days. Dx with Covid and wishes to "get the infusion" ?monoclonal antibodies?. She is in no distress. She chooses to sit in a regular chair, which I allow her to do.

## 2020-08-03 NOTE — Discharge Instructions (Addendum)
Your information has been provided with the Infusion Center and they will call you if they have any availability in the next 6 days.   Continue monitoring your symptoms at home. Drink plenty of fluids to stay hydrated. Take Tylenol as needed for fevers > 100.4.   Follow up with your PCP once you are finished with your isolation for reevaluation.   Return to the ED IMMEDIATELY for any worsening symptoms including shortness of breath, severe chest pain, fingers/lips turning blue, inability to waken easily, new confusion, or any other new/concerning symptoms.

## 2020-08-03 NOTE — ED Provider Notes (Signed)
[facility] EMERGENCY DEPARTMENT Provider Note   CSN: [id number] Arrival date & time: 08/03/20  1937     History Chief Complaint  Patient presents with  . URI    Megan Riley is a 80 y.o. female with PMHx stroke/TIA, A fib on Eliquis, arthritis who presents to the ED today requesting MAB.  Patient was seen in the ED yesterday with complaints of upper respiratory infection and tested positive for COVID-19.  It does appear that monoclonal antibodies were discussed with patient yesterday however she declined and she was discharged home.  She returns today as she has changed her mind she would like to receive the antibodies.  She denies any worsening symptoms at this time.  Denies shortness of breath.  Has been taking over-the-counter medications for her fever.   The history is provided by the patient and medical records.       Past Medical History:  Diagnosis Date  . A-fib ([place])   . Arthritis   . Kidney stones   . Stroke ([place])   . TIA (transient ischemic attack)     There are no problems to display for this patient.   Past Surgical History:  Procedure Laterality Date  . ABDOMINAL HYSTERECTOMY    . APPENDECTOMY    . CHOLECYSTECTOMY    . HERNIA REPAIR    . MASTECTOMY    . OOPHORECTOMY    . TOTAL KNEE ARTHROPLASTY       OB History   No obstetric history on file.     No family history on file.  Social History   Tobacco Use  . Smoking status: Never Smoker  . Smokeless tobacco: Never Used  Vaping Use  . Vaping Use: Never used  Substance Use Topics  . Alcohol use: No  . Drug use: No    Home Medications Prior to Admission medications   Medication Sig Start Date End Date Taking? Authorizing Provider  Ascorbic Acid (VITAMIN C) 500 MG CAPS Take by mouth.    [provider]  atenolol (TENORMIN) 25 MG tablet Take daily by mouth.    [provider]  Calcium Carb-Cholecalciferol (CALCIUM-VITAMIN D) 500-200 MG-UNIT tablet Take by mouth.     [provider]  ELIQUIS 5 MG TABS tablet  03/26/17   [provider]  esomeprazole (NEXIUM) 20 MG capsule Take 20 mg by mouth.    [provider]  ezetimibe (ZETIA) 10 MG tablet Take 10 mg by mouth daily.    [provider]  Multiple Vitamin (MULTI-VITAMINS) TABS Take by mouth.    [provider]  Ranitidine HCl (ZANTAC PO) Take as needed by mouth.     [provider]    Allergies    Peppermint oil, Phentermine, Reglan [metoclopramide], Statins, Hydromorphone, Meperidine, and Oxycodone-aspirin  Review of Systems   Review of Systems  Constitutional: Positive for chills, fatigue and fever.  Respiratory: Positive for cough. Negative for shortness of breath.   All other systems reviewed and are negative.   Physical Exam Updated Vital Signs BP 122/60 (BP Location: Right Arm)   Pulse 79   Temp 98.2 F (36.8 C) (Oral)   Resp (!) 21   SpO2 93%   Physical Exam Vitals and nursing note reviewed.  Constitutional:      Appearance: She is not ill-appearing.  HENT:     Head: Normocephalic and atraumatic.  Eyes:     Conjunctiva/sclera: Conjunctivae normal.  Cardiovascular:     Rate and Rhythm: Normal rate and  regular rhythm.     Pulses: Normal pulses.  Pulmonary:     Effort: Pulmonary effort is normal.     Breath sounds: Normal breath sounds. No wheezing, rhonchi or rales.     Comments: Speaking in full sentences without difficulty.  Satting 94% on room air.  No increased work of breathing.  Lungs clear to auscultation bilaterally. Skin:    General: Skin is warm and dry.     Coloration: Skin is not jaundiced.  Neurological:     Mental Status: She is alert.     ED Results / Procedures / Treatments   Labs (all labs ordered are listed, but only abnormal results are displayed) Labs Reviewed - No data to display  EKG None  Radiology DG Chest Portable 1 View  Result Date: 08/02/2020 CLINICAL DATA:  Cough for 3 days.   Positive home COVID test today. EXAM: PORTABLE CHEST 1 VIEW COMPARISON:  Radiograph 10/07/2019, CT 10/12/2019 FINDINGS: The cardiomediastinal contours are normal. Mild aortic atherosclerosis. Pulmonary vasculature is normal. No consolidation, pleural effusion, or pneumothorax. No acute osseous abnormalities are seen. Degenerative change throughout the spine with scoliosis. Peripherally calcified breast implants causing mild overlying artifact at the lung bases. IMPRESSION: No acute chest findings. No evidence of pneumonia. Electronically Signed   By: Narda Rutherford M.D.   On: 08/02/2020 17:47    Procedures Procedures (including critical care time)  Medications Ordered in ED Medications - No data to display  ED Course  I have reviewed the triage vital signs and the nursing notes.  Pertinent labs & imaging results that were available during my care of the patient were reviewed by me and considered in my medical decision making (see chart for details).    MDM Rules/Calculators/A&P                          80 year old female presents to the ED today requesting monoclonal antibodies that she tested positive for COVID-19 yesterday and is unvaccinated.  She was seen in the ED yesterday for same and offered monoclonal antibodies however declined.  She is here today as she wants to have this done.  Unfortunately we do not have monoclonal antibodies any longer at this facility.  She denies any worsening symptoms, appears to be in no acute distress, satting appropriately above 94% on room air without increased work of breathing.  She does qualify given she is unvaccinated, BMI, age, underlying health conditions.  Will provide monoclonal antibody infusion center with her information so that they can contact her.  She is on day 4 of symptoms.  Patient is instructed to continue self isolating at home and to take Tylenol as needed for fevers.  Encouraged to drink plenty of fluids to stay hydrated and to return  to the ED if she starts feeling any shortness of breath, finger/lips turning blue, inability to arouse easily, confusion, severe chest pain.  Patient is in agreement with plan is stable for discharge at this time.   This note was prepared using Dragon voice recognition software and may include unintentional dictation errors due to the inherent limitations of voice recognition software.  Megan Riley was evaluated in Emergency Department on 08/03/2020 for the symptoms described in the history of present illness. She was evaluated in the context of the global COVID-19 pandemic, which necessitated consideration that the patient might be at risk for infection with the SARS-CoV-2 virus that causes COVID-19. Institutional protocols and algorithms that pertain to the  evaluation of patients at risk for COVID-19 are in a state of rapid change based on information released by regulatory bodies including the CDC and federal and state organizations. These policies and algorithms were followed during the patient's care in the ED.  Final Clinical Impression(s) / ED Diagnoses Final diagnoses:  COVID-19    Rx / DC Orders ED Discharge Orders    None       Discharge Instructions     Your information has been provided with the Infusion Center and they will call you if they have any availability in the next 6 days.   Continue monitoring your symptoms at home. Drink plenty of fluids to stay hydrated. Take Tylenol as needed for fevers > 100.4.   Follow up with your PCP once you are finished with your isolation for reevaluation.   Return to the ED IMMEDIATELY for any worsening symptoms including shortness of breath, severe chest pain, fingers/lips turning blue, inability to waken easily, new confusion, or any other new/concerning symptoms.        Tanda Rockers, PA-C 08/03/20 1042    Terrilee Files, MD 08/03/20 [phone]

## 2020-08-05 ENCOUNTER — Other Ambulatory Visit: Payer: Self-pay | Admitting: Family

## 2020-08-05 ENCOUNTER — Telehealth (HOSPITAL_COMMUNITY): Payer: Self-pay

## 2020-08-05 DIAGNOSIS — U071 COVID-19: Secondary | ICD-10-CM

## 2020-08-05 NOTE — Telephone Encounter (Signed)
I connected by phone with Elder Negus on 08/05/2020 at 1:10 PM to discuss the potential use of a new treatment for mild to moderate COVID-19 viral infection in non-hospitalized patients.  This patient is a 80 y.o. female that meets the FDA criteria for Emergency Use Authorization of COVID monoclonal antibody casirivimab/imdevimab, bamlanivimab/etesevimab, or sotrovimab.  Has a (+) direct SARS-CoV-2 viral test result  Has mild or moderate COVID-19   Is NOT hospitalized due to COVID-19  Is within 10 days of symptom onset  Has at least one of the high risk factor(s) for progression to severe COVID-19 and/or hospitalization as defined in EUA.  Specific high risk criteria : Older age (>/= 78 yo), BMI > 25, Cardiovascular disease or hypertension and Other high risk medical condition per CDC:  unvaccinated, history of CVA   Date of symptoms 07/31/20.  Positive home test 08/01/20 (will bring photo)  I have spoken and communicated the following to the patient or parent/caregiver regarding COVID monoclonal antibody treatment:  1. FDA has authorized the emergency use for the treatment of mild to moderate COVID-19 in adults and pediatric patients with positive results of direct SARS-CoV-2 viral testing who are 59 years of age and older weighing at least 40 kg, and who are at high risk for progressing to severe COVID-19 and/or hospitalization.  2. The significant known and potential risks and benefits of COVID monoclonal antibody, and the extent to which such potential risks and benefits are unknown.  3. Information on available alternative treatments and the risks and benefits of those alternatives, including clinical trials.  4. Patients treated with COVID monoclonal antibody should continue to self-isolate and use infection control measures (e.g., wear mask, isolate, social distance, avoid sharing personal items, clean and disinfect "high touch" surfaces, and frequent handwashing) according to CDC  guidelines.   5. The patient or parent/caregiver has the option to accept or refuse COVID monoclonal antibody treatment.  After reviewing this information with the patient, the patient has agreed to receive one of the available covid 19 monoclonal antibodies and will be provided an appropriate fact sheet prior to infusion.'  Alver Sorrow, NP 08/05/2020 1:10 PM

## 2020-08-05 NOTE — Telephone Encounter (Signed)
Called to discuss with patient about COVID-19 symptoms and the use of one of the available treatments for those with mild to moderate Covid symptoms and at a high risk of hospitalization.     Pt appears to qualify for this infusion due to co-morbid conditions and/or a member of an at-risk group in accordance with the FDA Emergency Use Authorization.    Symptom onset:  12/29 approximately. Difficult to pinpoint exact sx onset for patient. Sx include Headache, weakness, and bodyaches  Tested: (+) 12/30 via at home test kit.  (+)12/31at [facility] ED  Vaccinated:  No  Qualified for Infusion: Hx of stroke, age over 60, A-Fib, BMI over 25.     Megan Riley

## 2020-08-06 ENCOUNTER — Ambulatory Visit (HOSPITAL_COMMUNITY)
Admission: RE | Admit: 2020-08-06 | Discharge: 2020-08-06 | Disposition: A | Discharge status: Home / Self Care | Payer: Medicare Other | Source: Ambulatory Visit | Attending: Pulmonary Disease | Admitting: Pulmonary Disease

## 2020-08-06 DIAGNOSIS — U071 COVID-19: Secondary | ICD-10-CM

## 2020-08-06 DIAGNOSIS — Z8673 Personal history of transient ischemic attack (TIA), and cerebral infarction without residual deficits: Secondary | ICD-10-CM | POA: Diagnosis not present

## 2020-08-06 MED ORDER — SODIUM CHLORIDE 0.9 % IV SOLN: Freq: Once | INTRAVENOUS | Status: AC

## 2020-08-06 MED ORDER — EPINEPHRINE 0.3 MG/0.3ML IJ SOAJ: 0.3000 mg | Freq: Once | INTRAMUSCULAR | Status: DC | PRN

## 2020-08-06 MED ORDER — SODIUM CHLORIDE 0.9 % IV SOLN: INTRAVENOUS | Status: DC | PRN

## 2020-08-06 MED ORDER — METHYLPREDNISOLONE SODIUM SUCC 125 MG IJ SOLR: 125.0000 mg | Freq: Once | INTRAMUSCULAR | Status: DC | PRN

## 2020-08-06 MED ORDER — FAMOTIDINE IN NACL 20-0.9 MG/50ML-% IV SOLN
20.0000 mg | Freq: Once | INTRAVENOUS | Status: DC | PRN
Start: 2020-08-06 — End: 2020-08-07

## 2020-08-06 MED ORDER — SOTROVIMAB 500 MG/8ML IV SOLN
500.0000 mg | Freq: Once | INTRAVENOUS | Status: AC
  Administered 2020-08-06: 500 mg via INTRAVENOUS

## 2020-08-06 MED ORDER — ALBUTEROL SULFATE HFA 108 (90 BASE) MCG/ACT IN AERS: 2.0000 | INHALATION_SPRAY | Freq: Once | RESPIRATORY_TRACT | Status: DC | PRN

## 2020-08-06 MED ORDER — DIPHENHYDRAMINE HCL 50 MG/ML IJ SOLN: 50.0000 mg | Freq: Once | INTRAMUSCULAR | Status: DC | PRN

## 2020-08-06 NOTE — Progress Notes (Signed)
Diagnosis: COVID-19  Physician: Dr. Patrick Wright  Procedure: [facility] Med: Sotrovimab infusion - Provided patient with sotrovimab fact sheet for patients, parents, and caregivers prior to infusion.   Complications: No immediate complications noted  Discharge: Discharged home    

## 2020-08-06 NOTE — Discharge Instructions (Signed)
10 Things You Can Do to Manage Your COVID-19 Symptoms at Home If you have possible or confirmed COVID-19: 1. Stay home from work and school. And stay away from other public places. If you must go out, avoid using any kind of public transportation, ridesharing, or taxis. 2. Monitor your symptoms carefully. If your symptoms get worse, call your healthcare provider immediately. 3. Get rest and stay hydrated. 4. If you have a medical appointment, call the healthcare provider ahead of time and tell them that you have or may have COVID-19. 5. For medical emergencies, call 911 and notify the dispatch personnel that you have or may have COVID-19. 6. Cover your cough and sneezes with a tissue or use the inside of your elbow. 7. Wash your hands often with soap and water for at least 20 seconds or clean your hands with an alcohol-based hand sanitizer that contains at least 60% alcohol. 8. As much as possible, stay in a specific room and away from other people in your home. Also, you should use a separate bathroom, if available. If you need to be around other people in or outside of the home, wear a mask. 9. Avoid sharing personal items with other people in your household, like dishes, towels, and bedding. 10. Clean all surfaces that are touched often, like counters, tabletops, and doorknobs. Use household cleaning sprays or wipes according to the label instructions. cdc.gov/coronavirus 02/01/2019 This information is not intended to replace advice given to you by your health care provider. Make sure you discuss any questions you have with your health care provider. Document Revised: 07/06/2019 Document Reviewed: 07/06/2019 Elsevier Patient Education  2020 [employer] Inc. What types of side effects do monoclonal antibody drugs cause?  Common side effects  In general, the more common side effects caused by monoclonal antibody drugs include: . Allergic reactions, such as hives or itching . Flu-like signs and  symptoms, including chills, fatigue, fever, and muscle aches and pains . Nausea, vomiting . Diarrhea . Skin rashes . Low blood pressure   The CDC is recommending patients who receive monoclonal antibody treatments wait at least 90 days before being vaccinated.  Currently, there are no data on the safety and efficacy of mRNA COVID-19 vaccines in persons who received monoclonal antibodies or convalescent plasma as part of COVID-19 treatment. Based on the estimated half-life of such therapies as well as evidence suggesting that reinfection is uncommon in the 90 days after initial infection, vaccination should be deferred for at least 90 days, as a precautionary measure until additional information becomes available, to avoid interference of the antibody treatment with vaccine-induced immune responses. If you have any questions or concerns after the infusion please call the Advanced Practice Provider on call at [phone]. This number is ONLY intended for your use regarding questions or concerns about the infusion post-treatment side-effects.  Please do not provide this number to others for use. For return to work notes please contact your primary care provider.   If someone you know is interested in receiving treatment please have them call the COVID hotline at [phone].   

## 2020-08-06 NOTE — Progress Notes (Signed)
Spoke to [place], NP regarding low bp, 1 liter fluid bolus given and okay to start sotrovimab.

## 2020-08-06 NOTE — Progress Notes (Signed)
Patient reviewed Fact Sheet for Patients, Parents, and Caregivers for Emergency Use Authorization (EUA) of sotrovimab for the Treatment of Coronavirus. Patient also reviewed and is agreeable to the estimated cost of treatment. Patient is agreeable to proceed.   

## 2022-02-15 IMAGING — DX DG CHEST 1V PORT
1 series · 1 of 1 positions shown · non-contrast
Comparison: Radiograph 10/07/2019, CT 10/12/2019

CLINICAL DATA: Cough for 3 days.  Positive home COVID test today.

EXAM:
PORTABLE CHEST 1 VIEW

[chest ap]
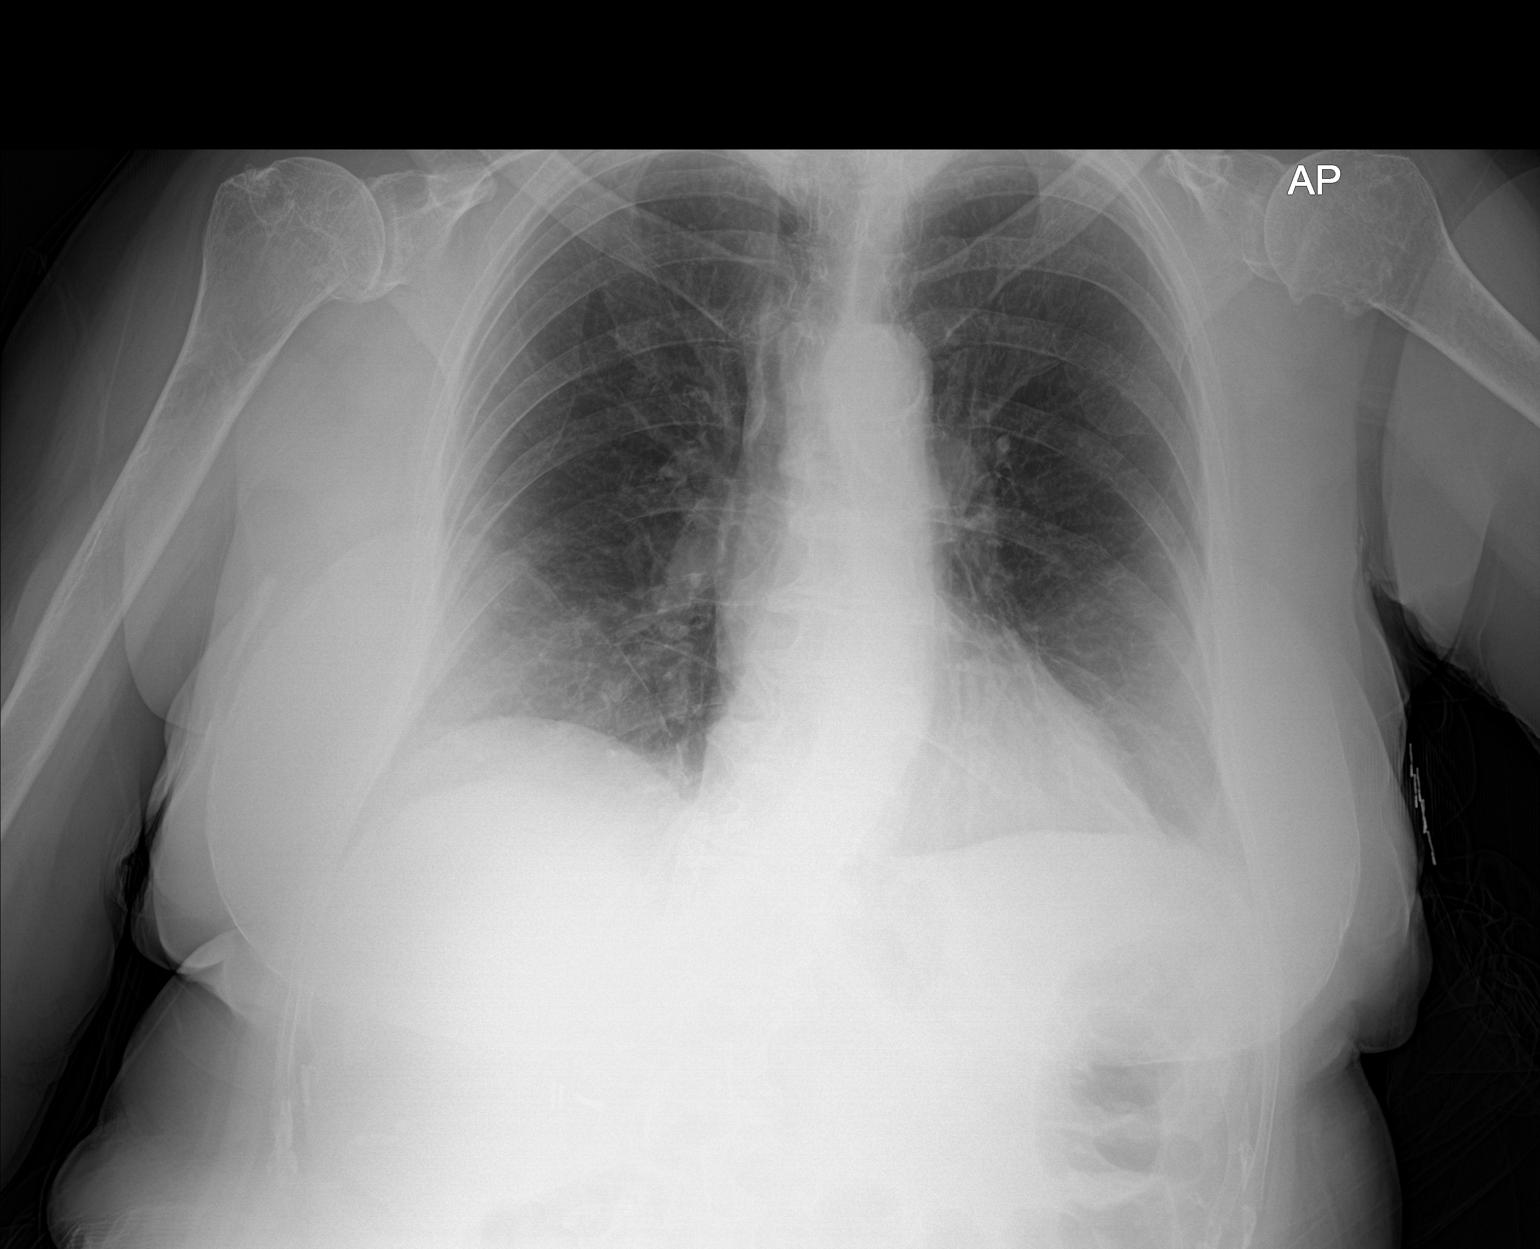

[1 of 1 positions shown; findings below may reference images not displayed]

FINDINGS: The cardiomediastinal contours are normal. Mild aortic
atherosclerosis. Pulmonary vasculature is normal. No consolidation,
pleural effusion, or pneumothorax. No acute osseous abnormalities
are seen. Degenerative change throughout the spine with scoliosis.
Peripherally calcified breast implants causing mild overlying
artifact at the lung bases.
IMPRESSION: No acute chest findings. No evidence of pneumonia.
